# Patient Record
Sex: Male | Born: 1980 | Race: White | Hispanic: No | Marital: Married | Smoking: Never smoker
Health system: Southern US, Community
[De-identification: ages and names within clinical notes are randomized; demographics above are authoritative.]

## PROBLEM LIST (undated history)

## (undated) DIAGNOSIS — R7989 Other specified abnormal findings of blood chemistry: Secondary | ICD-10-CM

## (undated) DIAGNOSIS — R7309 Other abnormal glucose: Secondary | ICD-10-CM

## (undated) DIAGNOSIS — K219 Gastro-esophageal reflux disease without esophagitis: Secondary | ICD-10-CM

## (undated) DIAGNOSIS — M109 Gout, unspecified: Secondary | ICD-10-CM

## (undated) HISTORY — DX: Other abnormal glucose: R73.09

## (undated) HISTORY — PX: ACHILLES TENDON SURGERY: SHX542

## (undated) HISTORY — DX: Other specified abnormal findings of blood chemistry: R79.89

## (undated) HISTORY — DX: Gastro-esophageal reflux disease without esophagitis: K21.9

## (undated) HISTORY — PX: SHOULDER SURGERY: SHX246

## (undated) HISTORY — DX: Gout, unspecified: M10.9

---

## 2009-02-22 ENCOUNTER — Ambulatory Visit: Payer: Self-pay | Admitting: Family Medicine

## 2011-10-23 ENCOUNTER — Ambulatory Visit: Payer: Self-pay | Admitting: General Practice

## 2013-03-05 ENCOUNTER — Ambulatory Visit: Payer: Self-pay | Admitting: Orthopedic Surgery

## 2013-03-18 ENCOUNTER — Ambulatory Visit: Payer: Self-pay | Admitting: Orthopedic Surgery

## 2013-03-18 LAB — PLATELET COUNT: Platelet: 298 10*3/uL (ref 150–440)

## 2013-03-31 ENCOUNTER — Emergency Department: Payer: Self-pay | Admitting: Emergency Medicine

## 2013-04-18 ENCOUNTER — Emergency Department: Payer: Self-pay | Admitting: Emergency Medicine

## 2013-04-18 LAB — CBC
HGB: 13.2 g/dL (ref 13.0–18.0)
MCHC: 34.9 g/dL (ref 32.0–36.0)
RDW: 13.6 % (ref 11.5–14.5)
WBC: 15.4 10*3/uL — ABNORMAL HIGH (ref 3.8–10.6)

## 2013-04-18 LAB — BASIC METABOLIC PANEL
Anion Gap: 7 (ref 7–16)
Creatinine: 1.25 mg/dL (ref 0.60–1.30)
EGFR (African American): >60
Osmolality: 278 (ref 275–301)
Potassium: 3.3 mmol/L — ABNORMAL LOW (ref 3.5–5.1)
Sodium: 137 mmol/L (ref 136–145)

## 2014-03-11 IMAGING — US ULTRASOUND CORE BIOPSY
1 series · 13 of 13 positions shown · non-contrast
Comparison: none

REASON FOR EXAM: R ganglion cyst
COMMENTS:

PROCEDURE:     US  - US GUIDED BX/ASPIRATION NOT BR  - March 18, 2013  [DATE]
RESULT:     History: Ganglion cyst.

[Series 1: ultrasound core biopsy · 0.08mm/px · 13 acquisitions, 13 frames shown]
[im 1/13]
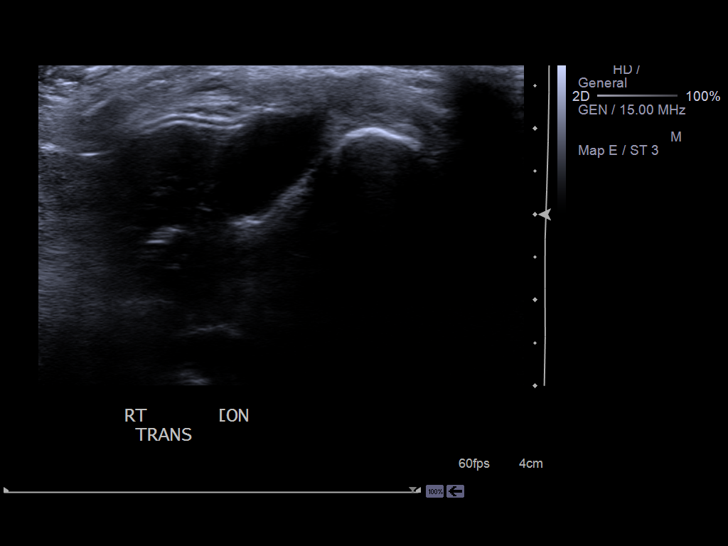
[im 2/13]
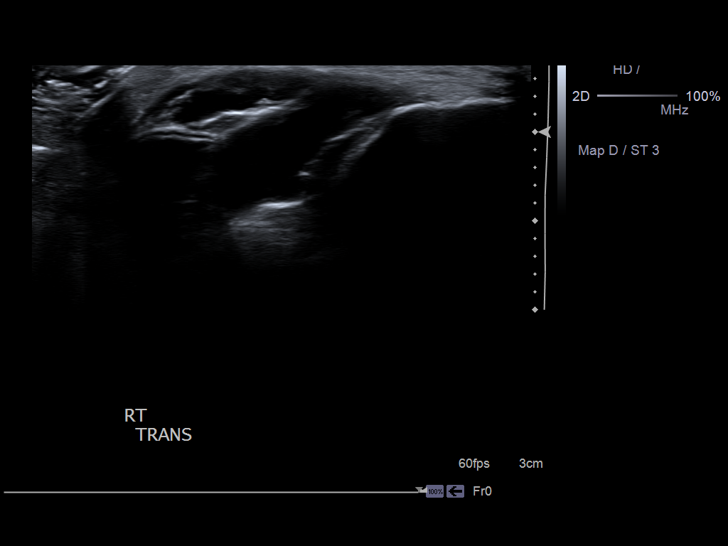
[im 3/13]
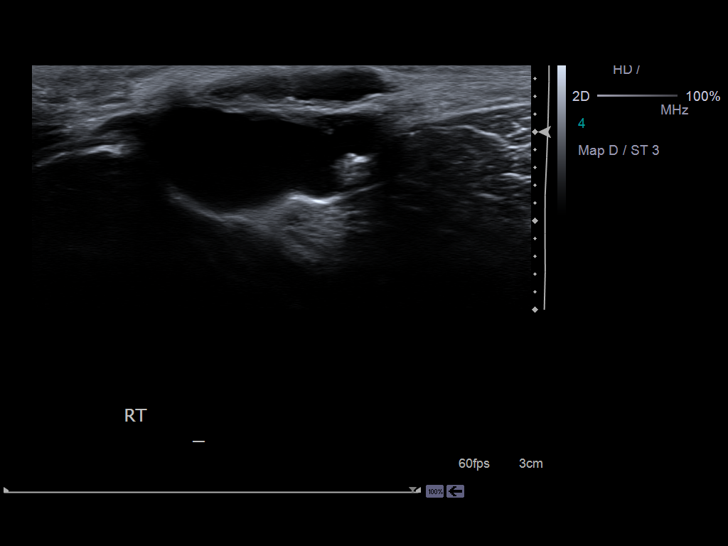
[im 4/13]
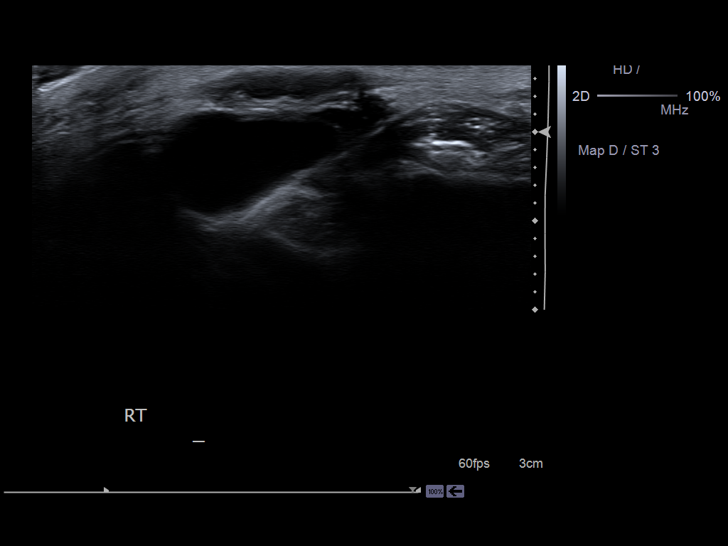
[im 5/13]
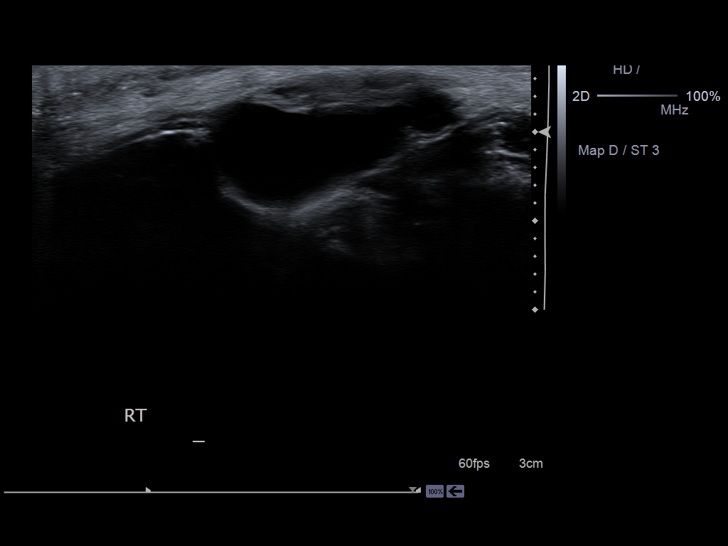
[im 6/13]
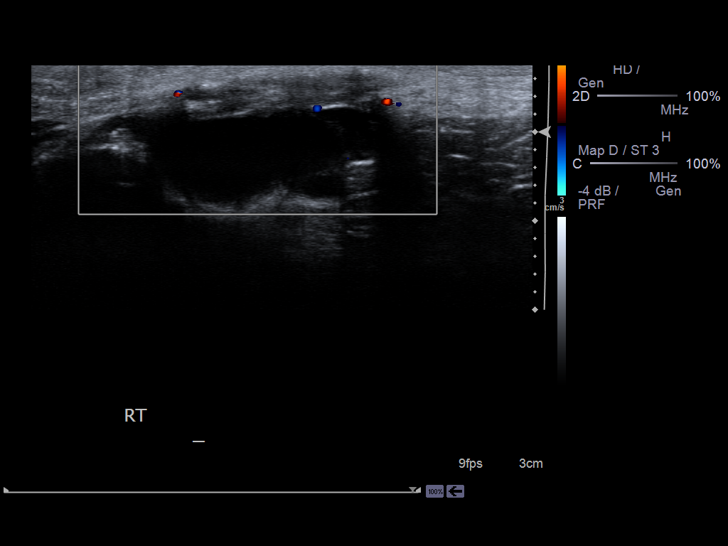
[im 7/13]
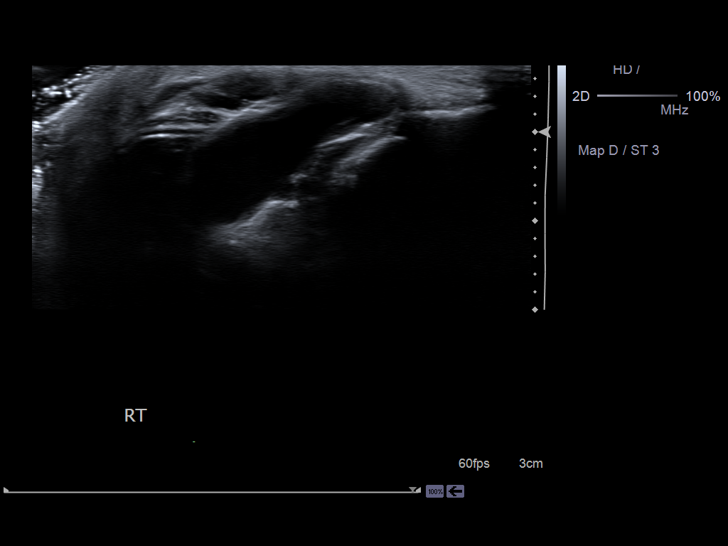
[im 8/13]
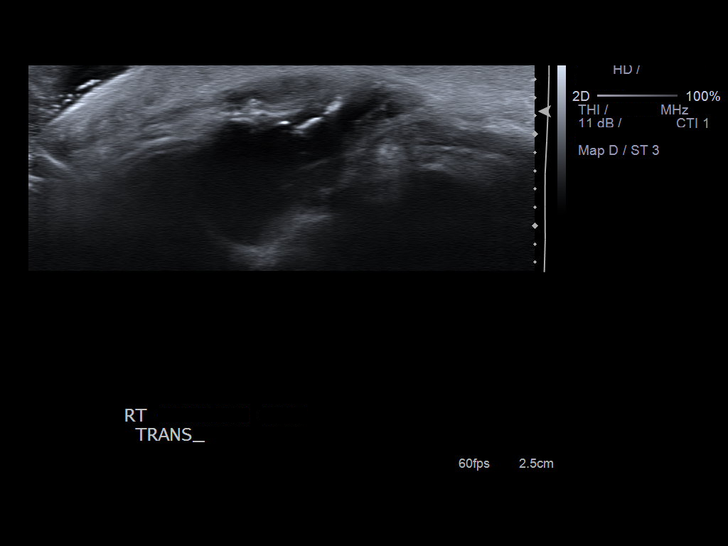
[im 9/13]
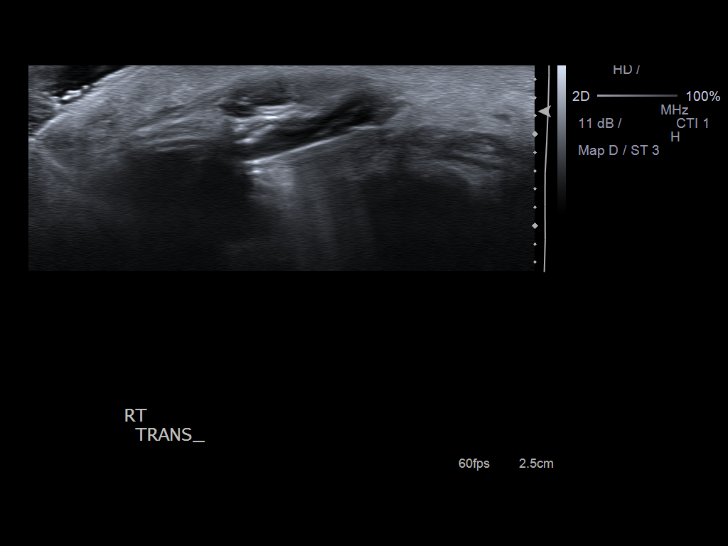
[im 10/13]
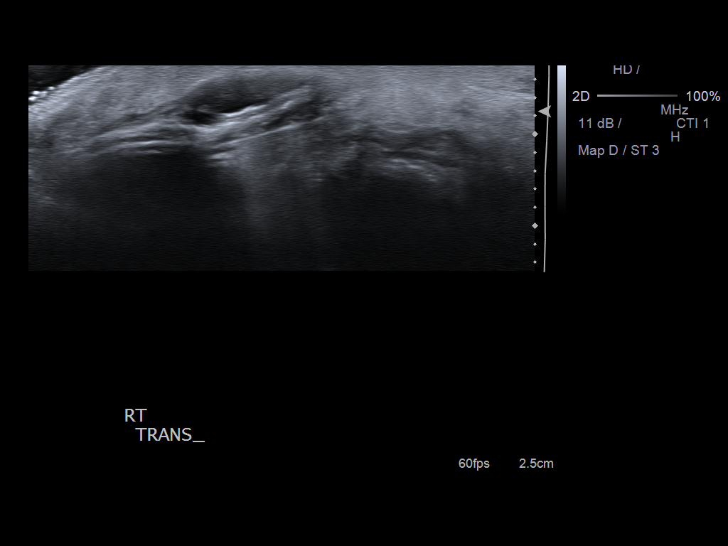
[im 11/13]
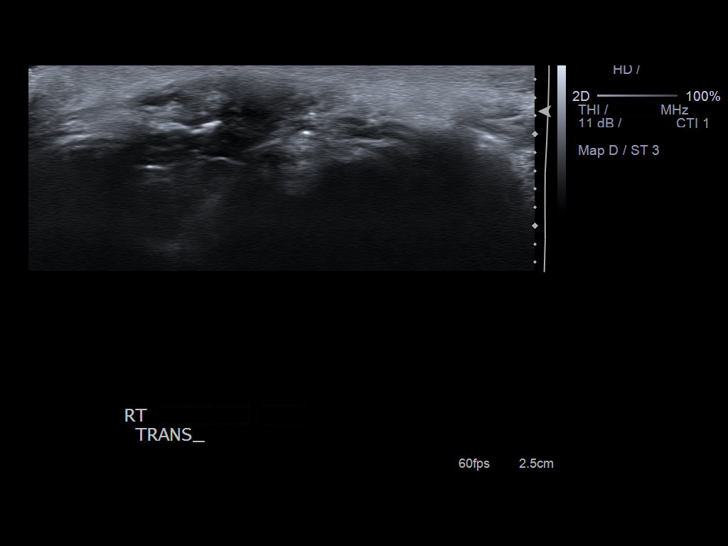
[im 12/13]
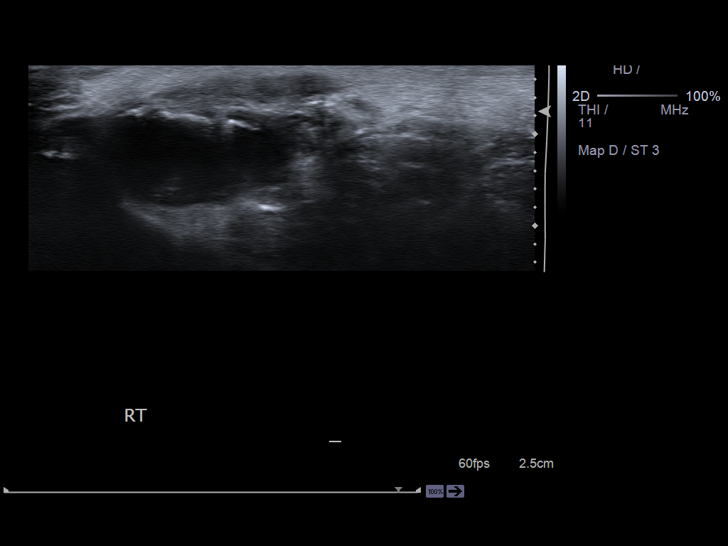
[im 13/13]
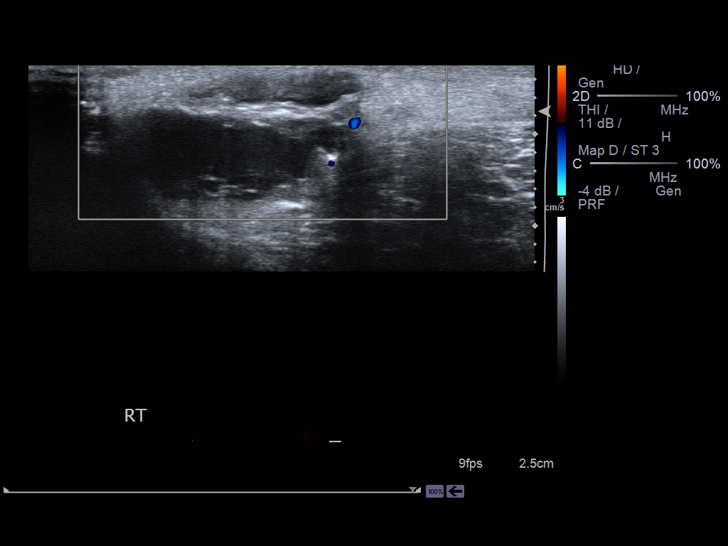

[13 of 13 positions shown; findings below may reference images not displayed]

FINDINGS: After discussing the risk and benefits of this procedure with
patient informed consent was obtained. Soft tissues about the cyst
sterilely prepped and draped and following local SC 1% lidocaine an 18-gauge
spinal needle was advanced into the ganglion cyst  and approximately 3 cc of
clear gelatinous fluid removed. Standardized mixture of flow 1% lidocaine
and Celestone administered into the cyst. No complications.
IMPRESSION: Successful right knee ganglia cyst aspiration and steroid
injection.

## 2014-06-03 ENCOUNTER — Ambulatory Visit: Payer: Self-pay | Admitting: General Practice

## 2014-06-10 ENCOUNTER — Emergency Department: Payer: Self-pay | Admitting: Emergency Medicine

## 2014-06-10 LAB — CBC WITH DIFFERENTIAL/PLATELET
BASOS ABS: 0.1 10*3/uL (ref 0.0–0.1)
Basophil %: 0.6 %
EOS PCT: 0.9 %
Eosinophil #: 0.1 10*3/uL (ref 0.0–0.7)
HCT: 42.1 % (ref 40.0–52.0)
HGB: 14.1 g/dL (ref 13.0–18.0)
LYMPHS ABS: 2.6 10*3/uL (ref 1.0–3.6)
LYMPHS PCT: 32.5 %
MCH: 27.8 pg (ref 26.0–34.0)
MCHC: 33.5 g/dL (ref 32.0–36.0)
MCV: 83 fL (ref 80–100)
MONO ABS: 0.5 x10 3/mm (ref 0.2–1.0)
MONOS PCT: 6.2 %
NEUTROS ABS: 4.7 10*3/uL (ref 1.4–6.5)
NEUTROS PCT: 59.8 %
Platelet: 358 10*3/uL (ref 150–440)
RBC: 5.07 10*6/uL (ref 4.40–5.90)
RDW: 13.7 % (ref 11.5–14.5)
WBC: 7.8 10*3/uL (ref 3.8–10.6)

## 2014-06-10 LAB — COMPREHENSIVE METABOLIC PANEL
ALBUMIN: 4.3 g/dL (ref 3.4–5.0)
ALK PHOS: 92 U/L
ANION GAP: 8 (ref 7–16)
BILIRUBIN TOTAL: 0.8 mg/dL (ref 0.2–1.0)
BUN: 9 mg/dL (ref 7–18)
CALCIUM: 9 mg/dL (ref 8.5–10.1)
CO2: 26 mmol/L (ref 21–32)
Chloride: 107 mmol/L (ref 98–107)
Creatinine: 0.97 mg/dL (ref 0.60–1.30)
EGFR (Non-African Amer.): >60
GLUCOSE: 90 mg/dL (ref 65–99)
OSMOLALITY: 279 (ref 275–301)
Potassium: 4.1 mmol/L (ref 3.5–5.1)
SGOT(AST): 46 U/L — ABNORMAL HIGH (ref 15–37)
SGPT (ALT): 58 U/L
Sodium: 141 mmol/L (ref 136–145)
Total Protein: 8.1 g/dL (ref 6.4–8.2)

## 2014-06-10 LAB — URINALYSIS, COMPLETE
BILIRUBIN, UR: NEGATIVE
Bacteria: NONE SEEN
Blood: NEGATIVE
Glucose,UR: NEGATIVE mg/dL (ref 0–75)
Nitrite: NEGATIVE
PH: 5 (ref 4.5–8.0)
PROTEIN: NEGATIVE
SPECIFIC GRAVITY: 1.024 (ref 1.003–1.030)
SQUAMOUS EPITHELIAL: NONE SEEN

## 2014-06-10 LAB — LIPASE, BLOOD: LIPASE: 88 U/L (ref 73–393)

## 2014-08-09 ENCOUNTER — Ambulatory Visit: Payer: Self-pay | Admitting: Unknown Physician Specialty

## 2014-08-20 ENCOUNTER — Ambulatory Visit: Payer: Self-pay | Admitting: Unknown Physician Specialty

## 2014-09-22 ENCOUNTER — Ambulatory Visit: Payer: Self-pay | Admitting: General Practice

## 2014-12-27 LAB — SURGICAL PATHOLOGY

## 2015-05-04 ENCOUNTER — Ambulatory Visit (INDEPENDENT_AMBULATORY_CARE_PROVIDER_SITE_OTHER): Payer: BLUE CROSS/BLUE SHIELD | Admitting: Podiatry

## 2015-05-04 ENCOUNTER — Ambulatory Visit (INDEPENDENT_AMBULATORY_CARE_PROVIDER_SITE_OTHER): Payer: BLUE CROSS/BLUE SHIELD

## 2015-05-04 ENCOUNTER — Encounter: Payer: Self-pay | Admitting: Podiatry

## 2015-05-04 VITALS — BP 122/77 | HR 73 | Resp 16 | Ht 72.0 in | Wt 205.0 lb

## 2015-05-04 DIAGNOSIS — M722 Plantar fascial fibromatosis: Secondary | ICD-10-CM

## 2015-05-04 MED ORDER — MELOXICAM 15 MG PO TABS: 15.0000 mg | ORAL_TABLET | Freq: Every day | ORAL | Status: DC

## 2015-05-04 MED ORDER — METHYLPREDNISOLONE 4 MG PO TBPK: ORAL_TABLET | ORAL | Status: DC

## 2015-05-04 NOTE — Patient Instructions (Signed)

## 2015-05-04 NOTE — Progress Notes (Signed)
   Subjective:    Patient ID: Travis Carpenter, male    DOB: 09/11/1980, 34 y.o.   MRN: [id number]  HPI presents today with a chief complaint of pain to the posterior aspect of his right heel and the plantar medial aspect of his left heel. He states that he also has some pain to the lateral aspect of the foot itself. States that his PCP diagnosed him with a gout attack a few weeks back and provided him with allopurinol and indomethacin. He states that portion of the foot still much better now. He states the pain on the plantar aspect of the right foot is painful particularly on ambulation initially and then is able to be stretched out. He also states that he has some tenderness in the posterior aspect of his right Achilles that is very similar.    Review of Systems  All other systems reviewed and are negative.      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 Neurologic sensorium is intact per Semmes-Weinstein monofilament. Deep tendon reflexes are intact bilaterally muscle strength +5 over 5 dorsiflexion plantar flexors and inverters everters all into the musculature is intact. Orthopedic evaluation and x-rays all joints distal to the ankle level range of motion without crepitation. He has pain on palpation medial calcaneal tubercle of the left heel as well as tenderness on palpation of the Achilles tendon on the posterior lateral aspect of the right heel. Radiographs 3 views each taken of bilateral foot demonstrates soft tissue increase in density plantar fascial calcaneal insertion sites bilaterally. Soft tissue increase in density at the Achilles tendon insertion site. Small Osier calcaneal heel spurs noted. Cutaneous evaluation of a straight supple well-hydrated cutis no erythema edema cellulitis drainage or odor.      Assessment & Plan:  Assessment: Insertional Achilles tendinitis right. Plantar  fasciitis left.  Plan: Discussed etiology pathology conservative versus surgical therapies. Started him on a Medrol Dosepak to be followed by meloxicam. Injected the medial calcaneal tubercle area today with Kenalog and local and aesthetic. Placed him in a plantar fascial brace and a night splint left. Discussed appropriate shoe gear straight excise and ice therapy which was dispensed today. I will follow-up with him in 1 month.

## 2015-05-15 DIAGNOSIS — S43431A Superior glenoid labrum lesion of right shoulder, initial encounter: Secondary | ICD-10-CM | POA: Insufficient documentation

## 2015-05-25 ENCOUNTER — Encounter: Payer: Self-pay | Admitting: Podiatry

## 2015-05-25 ENCOUNTER — Ambulatory Visit (INDEPENDENT_AMBULATORY_CARE_PROVIDER_SITE_OTHER): Payer: BLUE CROSS/BLUE SHIELD | Admitting: Podiatry

## 2015-05-25 VITALS — BP 134/83 | HR 65 | Resp 16

## 2015-05-25 DIAGNOSIS — M7661 Achilles tendinitis, right leg: Secondary | ICD-10-CM | POA: Diagnosis not present

## 2015-05-25 DIAGNOSIS — G5761 Lesion of plantar nerve, right lower limb: Secondary | ICD-10-CM

## 2015-05-25 DIAGNOSIS — M722 Plantar fascial fibromatosis: Secondary | ICD-10-CM | POA: Diagnosis not present

## 2015-05-25 DIAGNOSIS — G5791 Unspecified mononeuropathy of right lower limb: Secondary | ICD-10-CM

## 2015-05-25 NOTE — Progress Notes (Signed)
He presents today rates that his plantar fasciitis to his left foot is nearly 100% improved. He states that the Achilles laterally is still painful and he still has a burning sensation centrally located to the plantar aspect of the right foot. He states that this burning sensation has been there for quite a while and never changes. Whether he is on his feet or lying in bed he still has the burning sensation. It never intensifies and never changes. He continues to use his night splint though not regularly for his left foot. He continues his plantar fascial brace for his left foot regularly. He states that the medication really did not make a difference for his right foot.  Objective: Vital signs are stable he is alert and oriented 3. Pulses are strongly palpable. No pain on palpation medial calcaneal tubercle of the left heel. He does have some tenderness on palpation of the Achilles as it inserts on the posterior aspect of the right heel. He has no pain on palpation of the plantar calcaneal tubercle however that is the location of the burning.  Assessment: Well-healing plantar fasciitis left. Achilles tendinitis and neuritis right foot.  Plan: Discussed etiology pathology conservative versus surgical therapies. At this point I encouraged an injection to the right heel. This is performed with dexamethasone local anesthesia and was not injected into the tendon. I also injected with Kenalog to the plantar aspect of the right heel 2 in attempts to alleviate the burning. He will continue his antibiotic inflammatory is he was also scanned for several orthotics.

## 2015-06-01 ENCOUNTER — Encounter: Payer: Self-pay | Admitting: *Deleted

## 2015-06-15 ENCOUNTER — Encounter: Payer: Self-pay | Admitting: Podiatry

## 2015-06-15 ENCOUNTER — Ambulatory Visit (INDEPENDENT_AMBULATORY_CARE_PROVIDER_SITE_OTHER): Payer: BLUE CROSS/BLUE SHIELD | Admitting: Podiatry

## 2015-06-15 DIAGNOSIS — M722 Plantar fascial fibromatosis: Secondary | ICD-10-CM | POA: Diagnosis not present

## 2015-06-15 NOTE — Patient Instructions (Signed)

## 2015-06-16 NOTE — Progress Notes (Signed)
He presents today to pick up his orthotics. He states that the lateral aspect of his left foot is hurting. He states that the heel seems to be doing much better.  Objective: Vital signs are stable he is alert and oriented 3. Orthotics appear to fit accurately. He has some tenderness on palpation with overlying bursitis fifth metatarsal base left. No signs of fracture tendinitis no open wounds noted infection.  Assessment plantar fasciitis resolving with lateral compensatory syndrome and bursitis of the fifth metatarsal base fifth left.  Plan: I encouraged him to continue anti-inflammatories and utilize these orthotics. Follow up with him in 4-6 weeks.

## 2015-07-13 ENCOUNTER — Ambulatory Visit: Payer: BLUE CROSS/BLUE SHIELD | Admitting: Podiatry

## 2016-01-04 ENCOUNTER — Ambulatory Visit: Payer: BLUE CROSS/BLUE SHIELD | Admitting: Podiatry

## 2016-10-22 DIAGNOSIS — Z79899 Other long term (current) drug therapy: Secondary | ICD-10-CM | POA: Insufficient documentation

## 2016-10-22 DIAGNOSIS — M2550 Pain in unspecified joint: Secondary | ICD-10-CM | POA: Insufficient documentation

## 2016-10-22 DIAGNOSIS — M109 Gout, unspecified: Secondary | ICD-10-CM | POA: Insufficient documentation

## 2017-06-19 DIAGNOSIS — M25561 Pain in right knee: Secondary | ICD-10-CM | POA: Insufficient documentation

## 2018-04-04 ENCOUNTER — Emergency Department
Admission: EM | Admit: 2018-04-04 | Discharge: 2018-04-04 | Disposition: A | Discharge status: Home / Self Care | Payer: BLUE CROSS/BLUE SHIELD | Attending: Emergency Medicine | Admitting: Emergency Medicine

## 2018-04-04 ENCOUNTER — Encounter: Payer: Self-pay | Admitting: Emergency Medicine

## 2018-04-04 ENCOUNTER — Emergency Department: Payer: BLUE CROSS/BLUE SHIELD

## 2018-04-04 ENCOUNTER — Other Ambulatory Visit: Payer: Self-pay

## 2018-04-04 DIAGNOSIS — Y939 Activity, unspecified: Secondary | ICD-10-CM | POA: Diagnosis not present

## 2018-04-04 DIAGNOSIS — Y999 Unspecified external cause status: Secondary | ICD-10-CM | POA: Insufficient documentation

## 2018-04-04 DIAGNOSIS — Y929 Unspecified place or not applicable: Secondary | ICD-10-CM | POA: Diagnosis not present

## 2018-04-04 DIAGNOSIS — S46912A Strain of unspecified muscle, fascia and tendon at shoulder and upper arm level, left arm, initial encounter: Secondary | ICD-10-CM

## 2018-04-04 DIAGNOSIS — S46919A Strain of unspecified muscle, fascia and tendon at shoulder and upper arm level, unspecified arm, initial encounter: Secondary | ICD-10-CM | POA: Diagnosis not present

## 2018-04-04 DIAGNOSIS — S4992XA Unspecified injury of left shoulder and upper arm, initial encounter: Secondary | ICD-10-CM | POA: Diagnosis present

## 2018-04-04 MED ORDER — IBUPROFEN 600 MG PO TABS: 600.0000 mg | ORAL_TABLET | Freq: Three times a day (TID) | ORAL | 0 refills | Status: DC | PRN

## 2018-04-04 MED ORDER — DIAZEPAM 2 MG PO TABS: 2.0000 mg | ORAL_TABLET | Freq: Three times a day (TID) | ORAL | 0 refills | Status: DC | PRN
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
## 2018-04-04 NOTE — Discharge Instructions (Addendum)
Begin taking ibuprofen 600 mg 3 times daily with food for inflammation and pain.  Diazepam 2 mg as for muscle spasms.  You may take it every 8 hours if needed for muscle spasms but you cannot take this while driving.  This medication could cause drowsiness and increase your risk for injury.  Also use moist heat or ice to your shoulder for discomfort or swelling.  Follow-up with your primary care provider or [facility] acute care if any continued problems.

## 2019-02-09 ENCOUNTER — Other Ambulatory Visit: Payer: Self-pay

## 2019-02-09 ENCOUNTER — Encounter: Payer: Self-pay | Admitting: Podiatry

## 2019-02-09 ENCOUNTER — Ambulatory Visit (INDEPENDENT_AMBULATORY_CARE_PROVIDER_SITE_OTHER): Payer: BC Managed Care – PPO | Admitting: Podiatry

## 2019-02-09 ENCOUNTER — Ambulatory Visit: Payer: BC Managed Care – PPO

## 2019-02-09 VITALS — Temp 97.3°F

## 2019-02-09 DIAGNOSIS — M7661 Achilles tendinitis, right leg: Secondary | ICD-10-CM

## 2019-02-09 DIAGNOSIS — S86011A Strain of right Achilles tendon, initial encounter: Secondary | ICD-10-CM

## 2019-02-09 DIAGNOSIS — M722 Plantar fascial fibromatosis: Secondary | ICD-10-CM

## 2019-02-09 NOTE — Progress Notes (Signed)
  Subjective:  Patient ID: Travis Carpenter, male    DOB: 12-28-1980,  MRN: [id number] HPI Chief Complaint  Patient presents with  . 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 New Patient (Initial Visit)    Est pt 2016    38 y.o. male presents with the above complaint.   ROS: Denies fever chills nausea vomiting muscle aches pains calf pain back pain chest pain shortness of breath.  No past medical history on file. No past surgical history on file.  Current Outpatient Medications:  .  celecoxib (CELEBREX) 200 MG capsule, , Disp: , Rfl:   Allergies  Allergen Reactions  . Cortisone     Passed out after injection - patient stated it was not out fear, but something reacting to his body. He has since had a cortisone injection in his shoulder that he did fine with.   Review of Systems Objective:   Vitals:   02/09/19 1413  Temp: (!) 97.3 F (36.3 C)    General: Well developed, nourished, in no acute distress, alert and oriented x3   Dermatological: Skin is warm, dry and supple bilateral. Nails x 10 are well maintained; remaining integument appears unremarkable at this time. There are no open sores, no preulcerative lesions, no rash or signs of infection present.  Pain on palpation of soft tissue nodule in the posterior aspect of the calcaneus.  Cannot be sure if this an underlying bursitis or spur that is resulting in this.  Vascular: Dorsalis Pedis artery and Posterior Tibial artery pedal pulses are 2/4 bilateral with immedate capillary fill time. Pedal hair growth present. No varicosities and no lower extremity edema present bilateral.   Neruologic: Grossly intact via light touch bilateral. Vibratory intact via tuning fork bilateral. Protective threshold with Semmes Wienstein monofilament intact to all pedal sites bilateral. Patellar and Achilles deep tendon reflexes 2+ bilateral. No  Babinski or clonus noted bilateral.   Musculoskeletal: No gross boney pedal deformities bilateral. No pain, crepitus, or limitation noted with foot and ankle range of motion bilateral. Muscular strength 5/5 in all groups tested bilateral.  He has pain on palpation of the Achilles at its insertion site.  He has tight heel cord right.  Mildly erythematous and edematous with pain on direct palpation.  Gait: Unassisted, Nonantalgic.    Radiographs:  None were taken but I did review a read from her previous radiographs which did demonstrate a posterior spur.  Assessment & Plan:   Assessment: Insertional Achilles tendinitis cannot rule out insertional tear cannot rule out a foreign body in the soft tissue the posterior heel.  Plan: Requesting MRI for evaluation of the distal Achilles and retrocalcaneal area.     Max T. Appleby, [place]

## 2019-02-13 ENCOUNTER — Telehealth: Payer: Self-pay

## 2019-02-13 ENCOUNTER — Other Ambulatory Visit: Payer: Self-pay

## 2019-02-13 DIAGNOSIS — S86011A Strain of right Achilles tendon, initial encounter: Secondary | ICD-10-CM

## 2019-02-13 NOTE — Telephone Encounter (Signed)
-----   Message from Rip Harbour, [facility] sent at 02/09/2019  2:54 PM EDT ----- Regarding: MRI MRI right ankle - evaluate achilles tendon tear right - surgical consideration

## 2019-02-13 NOTE — Telephone Encounter (Signed)
MRI Approved from 02/13/2019 to 08/11/2019 Auth # [id number]  Patient has been notified of approval via voice mail and informed to call scheduling dept to set up own appt at his convenience.

## 2019-03-02 ENCOUNTER — Ambulatory Visit
Admission: RE | Admit: 2019-03-02 | Discharge: 2019-03-02 | Disposition: A | Discharge status: Home / Self Care | Payer: BC Managed Care – PPO | Source: Ambulatory Visit | Attending: Podiatry | Admitting: Podiatry

## 2019-03-02 ENCOUNTER — Other Ambulatory Visit: Payer: Self-pay

## 2019-03-02 DIAGNOSIS — S86011A Strain of right Achilles tendon, initial encounter: Secondary | ICD-10-CM

## 2019-03-03 ENCOUNTER — Telehealth: Payer: Self-pay | Admitting: *Deleted

## 2019-03-03 ENCOUNTER — Telehealth: Payer: Self-pay | Admitting: Podiatry

## 2019-03-03 NOTE — Telephone Encounter (Signed)
Patient was returning call, please call on [phone].

## 2019-03-03 NOTE — Telephone Encounter (Signed)
Left message for patient - MRI will be sent for over read and appointment will be needed at that time to discuss results.

## 2019-03-03 NOTE — Telephone Encounter (Signed)
-----   Message from Garrel Ridgel, [place] sent at 03/03/2019  7:37 AM EDT ----- I need an over read and the please inform the patient of the delay.

## 2019-03-03 NOTE — Telephone Encounter (Signed)
Sent fax over to [facility] requesting copy of MRI to send out for overread

## 2019-03-03 NOTE — Telephone Encounter (Signed)
Spoke to pt directly - let him know of delay of MRI results

## 2019-03-03 NOTE — Telephone Encounter (Signed)
   SI   Travis Carpenter  Male, 38 y.o., 1980/09/26  MRN: [id number]     Phone: [phone] (H) ...  PCP: Idelle Crouch, MD  Primary Cvg: [employer] Ppo       Next Appt  With Podiatry Garrel Ridgel, DPM) 03/18/2019 at 3:20 PM          Advice Only Horton, Levada Dy B routed conversation to You 3 hours ago (1:21 PM)      Horton, Levada Dy B 3 hours ago (1:21 PM)   Patient was returning call, please call on [phone].     Documentation        Roberta, Kelly [phone] Priscille Loveless B 3 hours ago (1:20 PM)                  Recent Patient Communication   Last Update Reason Specialty     Today Advice Only Podiatry    [place], [place] T Closed    Today Results Podiatry    [facility], Avanell [place] Open    2 weeks ago - Podiatry    [facility] [place]

## 2019-03-10 NOTE — Telephone Encounter (Signed)
Mailed copy of MRI disc to [facility]. 

## 2019-03-18 ENCOUNTER — Ambulatory Visit: Payer: BC Managed Care – PPO | Admitting: Podiatry

## 2019-03-23 ENCOUNTER — Encounter: Payer: Self-pay | Admitting: Podiatry

## 2019-03-24 ENCOUNTER — Telehealth: Payer: Self-pay | Admitting: *Deleted

## 2019-03-24 NOTE — Telephone Encounter (Signed)
-----   Message from Roney Jaffe, RN sent at 03/23/2019  2:37 PM EDT -----  ----- Message ----- From: Garrel Ridgel, DPM Sent: 03/23/2019   1:05 PM EDT To: Andres Ege, RN  NO tear of the achilles.  Might consider PT and if there is no change then follow up.

## 2019-03-24 NOTE — Telephone Encounter (Signed)
Called patient to inform him of MRI results-L/M - no achilles tear, recommended PT, if he would like to proceed with PT call back and let me know and I would send in the order for that.

## 2019-03-25 ENCOUNTER — Ambulatory Visit (INDEPENDENT_AMBULATORY_CARE_PROVIDER_SITE_OTHER): Payer: 59 | Admitting: Podiatry

## 2019-03-25 ENCOUNTER — Other Ambulatory Visit: Payer: Self-pay

## 2019-03-25 ENCOUNTER — Encounter: Payer: Self-pay | Admitting: Podiatry

## 2019-03-25 VITALS — Temp 98.2°F

## 2019-03-25 DIAGNOSIS — M7661 Achilles tendinitis, right leg: Secondary | ICD-10-CM | POA: Diagnosis not present

## 2019-03-25 NOTE — Progress Notes (Signed)
He presents today for follow-up of his Achilles tendinitis and then a MRI report.  He states that it still really hurts has not had much of a change.  Objective: Vital signs are stable he is alert and oriented x3.  Painful Achilles right on palpation at its insertion site.  MRI does state that there is an enthesopathy present but no true Achilles tendinitis but it does appear that there may be an insertional tendinopathy.  Assessment: Achilles tendinopathy with insertional enthesopathy.  Plan: We discussed the pros and cons of physical therapy today versus shockwave therapy versus open Achilles tendon repair.  He would like to talk to his wife about this after he and Sharyn Lull talk he will get back to [place] with their decision.

## 2019-04-08 ENCOUNTER — Encounter: Payer: Self-pay | Admitting: Podiatry

## 2019-04-08 ENCOUNTER — Other Ambulatory Visit: Payer: Self-pay

## 2019-04-08 ENCOUNTER — Ambulatory Visit (INDEPENDENT_AMBULATORY_CARE_PROVIDER_SITE_OTHER): Payer: 59 | Admitting: Podiatry

## 2019-04-08 ENCOUNTER — Telehealth: Payer: Self-pay | Admitting: Podiatry

## 2019-04-08 VITALS — Temp 98.1°F

## 2019-04-08 DIAGNOSIS — M7661 Achilles tendinitis, right leg: Secondary | ICD-10-CM | POA: Diagnosis not present

## 2019-04-08 NOTE — Patient Instructions (Signed)
Pre-Operative Instructions  Congratulations, you have decided to take an important step towards improving your quality of life.  You can be assured that the doctors and staff at [facility] will be with you every step of the way.  Here are some important things you should know:  1. Plan to be at the surgery center/hospital at least 1 (one) hour prior to your scheduled time, unless otherwise directed by the surgical center/hospital staff.  You must have a responsible adult accompany you, remain during the surgery and drive you home.  Make sure you have directions to the surgical center/hospital to ensure you arrive on time. 2. If you are having surgery at [facility] or [facility] hospitals, you will need a copy of your medical history and physical form from your family physician within one month prior to the date of surgery. We will give you a form for your primary physician to complete.  3. We make every effort to accommodate the date you request for surgery.  However, there are times where surgery dates or times have to be moved.  We will contact you as soon as possible if a change in schedule is required.   4. No aspirin/ibuprofen for one week before surgery.  If you are on aspirin, any non-steroidal anti-inflammatory medications (Mobic, Aleve, Ibuprofen) should not be taken seven (7) days prior to your surgery.  You make take Tylenol for pain prior to surgery.  5. Medications - If you are taking daily heart and blood pressure medications, seizure, reflux, allergy, asthma, anxiety, pain or diabetes medications, make sure you notify the surgery center/hospital before the day of surgery so they can tell you which medications you should take or avoid the day of surgery. 6. No food or drink after midnight the night before surgery unless directed otherwise by surgical center/hospital staff. 7. No alcoholic beverages 24-hours prior to surgery.  No smoking 24-hours prior or 24-hours after  surgery. 8. Wear loose pants or shorts. They should be loose enough to fit over bandages, boots, and casts. 9. Don't wear slip-on shoes. Sneakers are preferred. 10. Bring your boot with you to the surgery center/hospital.  Also bring crutches or a walker if your physician has prescribed it for you.  If you do not have this equipment, it will be provided for you after surgery. 11. If you have not been contacted by the surgery center/hospital by the day before your surgery, call to confirm the date and time of your surgery. 12. Leave-time from work may vary depending on the type of surgery you have.  Appropriate arrangements should be made prior to surgery with your employer. 13. Prescriptions will be provided immediately following surgery by your doctor.  Fill these as soon as possible after surgery and take the medication as directed. Pain medications will not be refilled on weekends and must be approved by the doctor. 14. Remove nail polish on the operative foot and avoid getting pedicures prior to surgery. 15. Wash the night before surgery.  The night before surgery wash the foot and leg well with water and the antibacterial soap provided. Be sure to pay special attention to beneath the toenails and in between the toes.  Wash for at least three (3) minutes. Rinse thoroughly with water and dry well with a towel.  Perform this wash unless told not to do so by your physician.  Enclosed: 1 Ice pack (please put in freezer the night before surgery)   1 Hibiclens skin cleaner     Pre-op instructions  If you have any questions regarding the instructions, please do not hesitate to call our office.  [place]: [address], [place], [place] [address] -- [phone]  [place]: [address] [place], [place] [address] -- [phone]  [place]: [address]  , [place] [address] -- [phone]  [place]: [address], [address], [place], [place] [address] -- [phone]  Website:  [email or web address] 

## 2019-04-08 NOTE — Telephone Encounter (Signed)
I saw Dr. Milinda Pointer today and was calling to schedule my surgery for Friday, 28 August. I told the pt I would put him in the book for that date and follow up with him tomorrow once I have his paperwork. I told him that he could go ahead and register with [facility] online. I also informed him someone would call him from there a day or two prior to his surgery to let him know what time to arrive.

## 2019-04-08 NOTE — Progress Notes (Signed)
He presents today for surgical consult regarding his Achilles tendon of his right leg.  He would like to go ahead and get it fixed as soon as possible.  Objective: Vital signs are stable he is alert and oriented x3 he has tight gastrosoleus complex with gastroc equinus.  This is resulted in a tear of the Achilles and its insertional point on the posterior heel.  He also has some plantar fasciitis all diagnosed per MRI.  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  Plan: Discussed etiology pathology conservative versus surgical therapies at this point consented him for a gastroc recession retrocalcaneal heel spur resection Achilles tenolysis and endoscopic fasciotomy with a cast.  I answered all questions regarding these procedures best my ability layman's terms understood and was amenable to it signed a 3 page of the consent form.

## 2019-04-09 NOTE — Telephone Encounter (Signed)
Called and lvm that I received consent forms and will get entered into [employer]. Told him someone would call a day or two prior to his surgery to let him know what time to arrive. Told him to call with any questions and/or concerns.

## 2019-04-14 ENCOUNTER — Telehealth: Payer: Self-pay | Admitting: *Deleted

## 2019-04-14 NOTE — Telephone Encounter (Signed)
"  I'm scheduled for surgery on August 28 with Dr. Milinda Pointer.  I need to change that."  Do you have a date that you would like?  "We've had someone that had to have an emergency surgery so I have to take care of them.  Is there anyway we can do it after October 10?"  Yes, Dr. Milinda Pointer can do it on June 19, 2019.  "That date will be fine."  I'll get it rescheduled.  I changed the date from 05/01/2019 to 06/19/2019 via the surgical center's [employer] Portal.

## 2019-04-14 NOTE — Telephone Encounter (Signed)
Thanks for letting me know!

## 2019-06-04 ENCOUNTER — Telehealth: Payer: Self-pay | Admitting: *Deleted

## 2019-06-04 NOTE — Telephone Encounter (Signed)
Spoke with the representative Fanny Skates) at [facility] and there is no pre-cert required and the reference number is [id number]. Travis Carpenter

## 2019-06-17 ENCOUNTER — Telehealth: Payer: Self-pay | Admitting: *Deleted

## 2019-06-17 NOTE — Telephone Encounter (Signed)
"  We have to cancel Travis Carpenter's surgery for Friday.  His Covid test came back positive."  I will cancel it.  "We'll call the patient and let him know.  Would you like a copy of the report?"  Yes, send it to me, please.

## 2019-06-17 NOTE — Telephone Encounter (Signed)
I have canceled his postop visits and took it off of Dr. Stephenie Acres schedule in [place] on Friday.

## 2019-06-17 NOTE — Telephone Encounter (Signed)
Take it off of Dr. Stephenie Acres schedule in [place].

## 2019-06-17 NOTE — Telephone Encounter (Signed)
Do I need to do anything other than cancelling his postop visits?

## 2019-06-23 ENCOUNTER — Telehealth: Payer: Self-pay | Admitting: Podiatry

## 2019-06-23 NOTE — Telephone Encounter (Signed)
I was calling to get a surgery date. If you would, just give me a call back.

## 2019-06-24 ENCOUNTER — Telehealth: Payer: Self-pay

## 2019-06-24 NOTE — Telephone Encounter (Signed)
I'm calling to get my surgery rescheduled. I've gotten everything straightened out from my last COVID-19 test. I've worked it out where I can do the surgery on  07/03/19 but was told to call and schedule the surgery with you. Please call me back. Thank you.

## 2019-06-24 NOTE — Telephone Encounter (Signed)
Called pt and confirmed that I did get his surgery rescheduled to next Friday, 07/03/19. Told pt I had already contacted the surgical center and that they would give him a call a day or two prior to his surgery to let him know what time to arrive.

## 2019-06-24 NOTE — Telephone Encounter (Signed)
Left message letting Caren Griffins know I left a voicemail for pt that we are rescheduling his surgery for 07/03/2019. Told her to call me back and let me know if she needed any of the papers/consent forms faxed or e-mailed to her.

## 2019-06-24 NOTE — Telephone Encounter (Signed)
Confirmed sx date with pt.

## 2019-06-24 NOTE — Telephone Encounter (Signed)
Patient left voice mail on nurse line , he had a question about his surgery date coming up with Dr Milinda Pointer.Please give him a call [phone]   Thanks!

## 2019-06-24 NOTE — Telephone Encounter (Signed)
Left voicemail letting pt know I got him rescheduled for surgery on 07/03/2019. I told him I would contact the surgical center to confirm the new sx date with them and re-send any paperwork and that someone from there would call him a day or two prior to let him know what time to arrive for his surgery. Told pt to call me back directly with any other questions.

## 2019-06-26 ENCOUNTER — Encounter: Payer: 59 | Admitting: Podiatry

## 2019-07-01 ENCOUNTER — Encounter: Payer: 59 | Admitting: Podiatry

## 2019-07-02 ENCOUNTER — Other Ambulatory Visit: Payer: Self-pay | Admitting: Podiatry

## 2019-07-02 MED ORDER — ONDANSETRON HCL 4 MG PO TABS: 4.0000 mg | ORAL_TABLET | Freq: Three times a day (TID) | ORAL | 0 refills | Status: DC | PRN

## 2019-07-02 MED ORDER — OXYCODONE-ACETAMINOPHEN 10-325 MG PO TABS: 1.0000 | ORAL_TABLET | Freq: Three times a day (TID) | ORAL | 0 refills | Status: AC | PRN

## 2019-07-02 MED ORDER — CEPHALEXIN 500 MG PO CAPS: 500.0000 mg | ORAL_CAPSULE | Freq: Three times a day (TID) | ORAL | 0 refills | Status: DC

## 2019-07-03 ENCOUNTER — Telehealth: Payer: Self-pay | Admitting: *Deleted

## 2019-07-03 ENCOUNTER — Encounter: Payer: Self-pay | Admitting: Podiatry

## 2019-07-03 DIAGNOSIS — M7731 Calcaneal spur, right foot: Secondary | ICD-10-CM

## 2019-07-03 DIAGNOSIS — M65871 Other synovitis and tenosynovitis, right ankle and foot: Secondary | ICD-10-CM

## 2019-07-03 DIAGNOSIS — M216X1 Other acquired deformities of right foot: Secondary | ICD-10-CM

## 2019-07-03 DIAGNOSIS — M722 Plantar fascial fibromatosis: Secondary | ICD-10-CM | POA: Diagnosis not present

## 2019-07-03 NOTE — Telephone Encounter (Signed)
I spoke with pt's wife Sharyn Lull, and informed the medications had been sent to the [facility] on [address].

## 2019-07-03 NOTE — Telephone Encounter (Signed)
-----   Message from Simone Curia sent at 07/03/2019  1:47 PM EDT ----- Regarding: Pain medication Patient's wife called and stated that Jenna had sx today but pain meds were not at the pharmacy. They are concerned with it being Friday that they may not get the meds before Monday. Please call patient back @ [phone]. Thanks!

## 2019-07-03 NOTE — Telephone Encounter (Signed)
Pt's wife, Sharyn Lull states Dr. Milinda Pointer said a pain medication was sent to his pharmacy, but it is not there. I reviewed pt's medication orders the [facility] [address] [place] yesterday 7:15am. Sharyn Lull states pt's medications usually go to [facility]. I changed the pharmacy to [facility] for future orders.

## 2019-07-08 ENCOUNTER — Ambulatory Visit (INDEPENDENT_AMBULATORY_CARE_PROVIDER_SITE_OTHER): Payer: Self-pay | Admitting: Podiatry

## 2019-07-08 ENCOUNTER — Encounter: Payer: Self-pay | Admitting: Podiatry

## 2019-07-08 ENCOUNTER — Ambulatory Visit (INDEPENDENT_AMBULATORY_CARE_PROVIDER_SITE_OTHER): Payer: 59

## 2019-07-08 ENCOUNTER — Other Ambulatory Visit: Payer: Self-pay

## 2019-07-08 VITALS — BP 104/77 | HR 97 | Temp 97.1°F

## 2019-07-08 DIAGNOSIS — M722 Plantar fascial fibromatosis: Secondary | ICD-10-CM | POA: Diagnosis not present

## 2019-07-08 DIAGNOSIS — Z9889 Other specified postprocedural states: Secondary | ICD-10-CM

## 2019-07-08 DIAGNOSIS — S86011A Strain of right Achilles tendon, initial encounter: Secondary | ICD-10-CM | POA: Diagnosis not present

## 2019-07-08 NOTE — Progress Notes (Signed)
He presents today date of surgery 07/03/2019 status post EPF right gastroc recession and Achilles tendon repair.  He denies fever chills nausea vomiting muscle aches and pains.  States that the worst thing is the cast rubbing his fifth toe on the right foot.  Objective: Presents nonweightbearing with crutches right lower extremity cast intact dry and clean cast is loose at the top and has good range of motion with good sensation in the tips of the toes.  Radiographs demonstrate complete resection of the spur with minimal swelling of the Achilles.  Well-healing surgical foot.  Plan: I took out a wedge of cast today to allow for his toe to move.  And I will follow-up with him in 1 week for total cast change.

## 2019-07-15 ENCOUNTER — Encounter: Payer: 59 | Admitting: Podiatry

## 2019-07-17 ENCOUNTER — Other Ambulatory Visit: Payer: Self-pay

## 2019-07-17 ENCOUNTER — Ambulatory Visit (INDEPENDENT_AMBULATORY_CARE_PROVIDER_SITE_OTHER): Payer: 59 | Admitting: Podiatry

## 2019-07-17 DIAGNOSIS — S86011A Strain of right Achilles tendon, initial encounter: Secondary | ICD-10-CM | POA: Diagnosis not present

## 2019-07-17 DIAGNOSIS — Z9889 Other specified postprocedural states: Secondary | ICD-10-CM

## 2019-07-17 DIAGNOSIS — M722 Plantar fascial fibromatosis: Secondary | ICD-10-CM

## 2019-07-20 ENCOUNTER — Other Ambulatory Visit: Payer: Self-pay

## 2019-07-20 ENCOUNTER — Other Ambulatory Visit: Payer: 59

## 2019-07-21 NOTE — Progress Notes (Signed)
   Subjective:  Patient presents today status post posterior heel spur resection, EPF, gastrocnemius lengthening right. DOS: 07/03/2019. He states he is doing well. He denies any significant pain or modifying factors. He has been nonweightbearing as directed. Patient is here for further evaluation and treatment.   No past medical history on file.    Objective/Physical Exam Neurovascular status intact.  Skin incisions appear to be well coapted with sutures and staples intact. No sign of infectious process noted. No dehiscence. No active bleeding noted. Moderate edema noted to the surgical extremity.  Assessment: 1. s/p posterior heel spur resection, EPF, gastrocnemius lengthening right. DOS: 07/03/2019   Plan of Care:  1. Patient was evaluated. 2. Cast removed and reapplied.  3. Partial sutures and staples removed.  4. Continue nonweightbearing.  5. Return to clinic in 2 weeks.    Edrick Kins, DPM [facility]  Dr. Edrick Kins, [address]                                        [place], [place] [address]                Office [phone]  Fax [phone]

## 2019-07-27 ENCOUNTER — Encounter: Payer: Self-pay | Admitting: Podiatry

## 2019-07-27 ENCOUNTER — Encounter: Payer: 59 | Admitting: Podiatry

## 2019-07-27 ENCOUNTER — Other Ambulatory Visit: Payer: Self-pay

## 2019-07-27 ENCOUNTER — Ambulatory Visit (INDEPENDENT_AMBULATORY_CARE_PROVIDER_SITE_OTHER): Payer: 59 | Admitting: Podiatry

## 2019-07-27 DIAGNOSIS — S86011A Strain of right Achilles tendon, initial encounter: Secondary | ICD-10-CM

## 2019-07-27 DIAGNOSIS — Z9889 Other specified postprocedural states: Secondary | ICD-10-CM

## 2019-07-27 DIAGNOSIS — M722 Plantar fascial fibromatosis: Secondary | ICD-10-CM

## 2019-07-27 MED ORDER — METHYLPREDNISOLONE 4 MG PO TBPK: ORAL_TABLET | ORAL | 0 refills | Status: DC

## 2019-07-27 NOTE — Progress Notes (Signed)
He presents today date of surgery 07/03/2019 status post EPF right Achilles tenolysis right gastroc recession right retrocalcaneal heel spur resection right manipulation of ankle right and cast application right.  He denies fever chills nausea vomiting muscle aches pains chest pain back pain shortness of breath.  Objective: Presents none ambulatory nonweightbearing status utilizing crutches.  Cast intact once removed demonstrates staples are intact margins are well coapted staples were removed no dehiscence no erythema cellulitis drainage or odor.  He has plantarflexion against resistance nice and strong.  He does have some swelling about the first metatarsophalangeal joint which it does appear to be gout and that it is mildly erythematous warm to the touch exquisitely tender.  He has a history of gout.  Assessment: Well-healing surgical leg and foot.  Gout.  Right.  Plan: Placed him in a cam walker with compression anklet today with a steroid Dosepak.  I encouraged him to start ambulating on the foot partial weightbearing at first progressing to full weightbearing as long as he is wearing his cam boot.  I will follow-up with him in 2 weeks.

## 2019-07-27 NOTE — Addendum Note (Signed)
Addended by: Rip Harbour on: 07/27/2019 02:33 PM   Modules accepted: Orders

## 2019-08-05 ENCOUNTER — Other Ambulatory Visit: Payer: Self-pay

## 2019-08-05 ENCOUNTER — Ambulatory Visit (INDEPENDENT_AMBULATORY_CARE_PROVIDER_SITE_OTHER): Payer: Self-pay | Admitting: Podiatry

## 2019-08-05 DIAGNOSIS — Z9889 Other specified postprocedural states: Secondary | ICD-10-CM

## 2019-08-05 DIAGNOSIS — S86011A Strain of right Achilles tendon, initial encounter: Secondary | ICD-10-CM

## 2019-08-05 MED ORDER — DOXYCYCLINE HYCLATE 100 MG PO TABS
100.0000 mg | ORAL_TABLET | Freq: Two times a day (BID) | ORAL | 0 refills | Status: DC
Start: 2019-08-05 — End: 2019-08-31

## 2019-08-05 MED ORDER — MUPIROCIN 2 % EX OINT: TOPICAL_OINTMENT | CUTANEOUS | 2 refills | Status: DC

## 2019-08-05 MED ORDER — INDOMETHACIN 50 MG RE SUPP: 50.0000 mg | Freq: Two times a day (BID) | RECTAL | 3 refills | Status: DC

## 2019-08-05 NOTE — Progress Notes (Signed)
He presents today for a follow-up of his retrocalcaneal bursitis Achilles tendon repair gastroc recession all on the right.  States that there is a mild dehiscence of the wound no purulence no malodor no fever chills states that his gout has now subsided.  Objective: Vital signs are stable he is alert oriented x3.  Pulses are palpable.  Right erythematous painful first metatarsophalangeal joint of the right foot which is warm to the touch very symptomatic of gout.  Very mild dehiscence to the distalmost aspect of the wound appears to be more of an epidermal problem does not have any drainage on the bandage at all.  Assessment: Well-healing surgical foot Achilles tendon repair right mild dehiscence distally gout first metatarsophalangeal joint.  Plan: Start him on indomethacin 50 mg 1 p.o. twice daily and also started him on doxycycline and Bactroban ointment to be applied twice daily after cleaning with a light dressing.  Continue use of the cam walker follow-up with him in 1 to 2 weeks for distal recheck.

## 2019-08-10 ENCOUNTER — Encounter: Payer: 59 | Admitting: Podiatry

## 2019-08-12 ENCOUNTER — Encounter: Payer: Self-pay | Admitting: Podiatry

## 2019-08-12 ENCOUNTER — Ambulatory Visit (INDEPENDENT_AMBULATORY_CARE_PROVIDER_SITE_OTHER): Payer: Self-pay | Admitting: Podiatry

## 2019-08-12 ENCOUNTER — Other Ambulatory Visit: Payer: Self-pay

## 2019-08-12 DIAGNOSIS — Z9889 Other specified postprocedural states: Secondary | ICD-10-CM

## 2019-08-12 DIAGNOSIS — S86011A Strain of right Achilles tendon, initial encounter: Secondary | ICD-10-CM

## 2019-08-12 DIAGNOSIS — M722 Plantar fascial fibromatosis: Secondary | ICD-10-CM

## 2019-08-12 NOTE — Progress Notes (Signed)
He presents today date of surgery 07/03/2019 status post EPF right Achilles tenolysis gastroc recession calcaneal ostectomy and manipulation of the ankle in the cast.  States that is doing okay is feeling sore at that spot where it drains still and it stings a little bit but all in all it is fine he denies fever chills nausea vomiting muscle aches and pains dresses it twice a day.  Objective: Vital signs are stable alert and oriented x3.  Pulses are palpable.  Surgical site appears to be healing very nicely I went ahead and removed any of the necrotic tissue today.  It bled a slight amount but there is no purulence no malodor and the erythema has diminished considerably.  There is no true area of delineation that is split open just a mild area of very superficial wound healing.  Assessment: Well-healing surgical site posterior aspect right.  Plan: Continue current therapies daily and I will follow-up with him in 2 weeks.

## 2019-08-26 ENCOUNTER — Encounter: Payer: 59 | Admitting: Podiatry

## 2019-08-31 ENCOUNTER — Encounter: Payer: Self-pay | Admitting: Podiatry

## 2019-08-31 ENCOUNTER — Other Ambulatory Visit: Payer: Self-pay

## 2019-08-31 ENCOUNTER — Ambulatory Visit (INDEPENDENT_AMBULATORY_CARE_PROVIDER_SITE_OTHER): Payer: Managed Care, Other (non HMO) | Admitting: Podiatry

## 2019-08-31 DIAGNOSIS — S86011A Strain of right Achilles tendon, initial encounter: Secondary | ICD-10-CM

## 2019-08-31 DIAGNOSIS — Z9889 Other specified postprocedural states: Secondary | ICD-10-CM

## 2019-08-31 DIAGNOSIS — M722 Plantar fascial fibromatosis: Secondary | ICD-10-CM

## 2019-08-31 NOTE — Progress Notes (Signed)
Presents today date of surgery 07/03/2019 status post EPF right Achilles tenolysis gastroc recession retrocalcaneal heel spur resection manipulation of the ankle.  He states that is doing much better and the soreness is resolving.  Objective: Vital signs are stable he is alert and oriented x3.  There is still mild edema no erythema cellulitis drainage or odor he has great plantarflexion against resistance and dorsiflexion against resistance.  He also has a well-healing wound distally.  No open lesions.  No purulence no malodor.  Assessment: Resolving insertional tenderness.  Resolving wound.  Well-healing surgical foot.  Plan: I encouraged him to get back into a pair of tennis shoes start padding this area so that he can start getting used to walking again he has good intentions and I will follow-up with him in 2 weeks

## 2019-09-16 ENCOUNTER — Encounter: Payer: Self-pay | Admitting: Podiatry

## 2019-09-16 ENCOUNTER — Ambulatory Visit (INDEPENDENT_AMBULATORY_CARE_PROVIDER_SITE_OTHER): Payer: Managed Care, Other (non HMO)

## 2019-09-16 ENCOUNTER — Ambulatory Visit (INDEPENDENT_AMBULATORY_CARE_PROVIDER_SITE_OTHER): Payer: Managed Care, Other (non HMO) | Admitting: Podiatry

## 2019-09-16 ENCOUNTER — Other Ambulatory Visit: Payer: Self-pay

## 2019-09-16 ENCOUNTER — Other Ambulatory Visit: Payer: Self-pay | Admitting: Podiatry

## 2019-09-16 DIAGNOSIS — Z9889 Other specified postprocedural states: Secondary | ICD-10-CM

## 2019-09-16 DIAGNOSIS — M7752 Other enthesopathy of left foot: Secondary | ICD-10-CM | POA: Diagnosis not present

## 2019-09-16 DIAGNOSIS — M109 Gout, unspecified: Secondary | ICD-10-CM

## 2019-09-16 DIAGNOSIS — M7662 Achilles tendinitis, left leg: Secondary | ICD-10-CM

## 2019-09-16 DIAGNOSIS — M722 Plantar fascial fibromatosis: Secondary | ICD-10-CM

## 2019-09-16 DIAGNOSIS — S86011A Strain of right Achilles tendon, initial encounter: Secondary | ICD-10-CM

## 2019-09-16 MED ORDER — INDOMETHACIN 50 MG PO CAPS: 50.0000 mg | ORAL_CAPSULE | Freq: Two times a day (BID) | ORAL | 1 refills | Status: AC

## 2019-09-16 NOTE — Progress Notes (Signed)
He presents today date of surgery 07/03/2019 status post endoscopic plantar fasciotomy and Achilles tenolysis gastroc recession retrocalcaneal heel spur resection manipulation of ankle and cast application on the right foot.  He states that his right foot is doing pretty well unless he overdoes it because he is compensating for his left foot.  His left foot is now starting to hurt it has been hurting since Saturday and he states that is really really painful right here as he points to the sinus tarsi of the left foot.  Objective: Vital signs are stable he is alert and oriented x3.  Pulses are palpable.  There is no erythema edema cellulitis drainage or odor he has pain on palpation of the distal Achilles as well as warmth red area overlying the sinus tarsi.  He does have a history of gout and this is considerably warm for just inflammation.  Assessment: Probable gout attack left.  Plan: Requesting blood work injected the sinus tarsi left with 20 mg of Kenalog 5 mg of Marcaine after sterile Betadine skin prep.  Went ahead and started him on indomethacin.  I will follow-up with him once the results come in.  Also we will see him back in a couple of weeks.  Extend his out of work paperwork.  He is in no way able to go back at this time.  I will reassess that when I see him for follow-up.  He is unable to take allopurinol due to stomach upset but he has never tried colchicine.

## 2019-09-17 LAB — RHEUMATOID FACTOR: Rhuematoid fact SerPl-aCnc: <10 IU/mL (ref 0.0–13.9)

## 2019-09-17 LAB — CBC WITH DIFFERENTIAL/PLATELET
Basophils Absolute: 0.1 10*3/uL (ref 0.0–0.2)
Basos: 1 %
EOS (ABSOLUTE): 0.1 10*3/uL (ref 0.0–0.4)
Eos: 1 %
Hematocrit: 41.4 % (ref 37.5–51.0)
Hemoglobin: 13.8 g/dL (ref 13.0–17.7)
Immature Grans (Abs): 0 10*3/uL (ref 0.0–0.1)
Immature Granulocytes: 0 %
Lymphocytes Absolute: 3 10*3/uL (ref 0.7–3.1)
Lymphs: 28 %
MCH: 27.6 pg (ref 26.6–33.0)
MCHC: 33.3 g/dL (ref 31.5–35.7)
MCV: 83 fL (ref 79–97)
Monocytes Absolute: 0.9 10*3/uL (ref 0.1–0.9)
Monocytes: 8 %
Neutrophils Absolute: 6.8 10*3/uL (ref 1.4–7.0)
Neutrophils: 62 %
Platelets: 362 10*3/uL (ref 150–450)
RBC: 5 x10E6/uL (ref 4.14–5.80)
RDW: 13 % (ref 11.6–15.4)
WBC: 10.8 10*3/uL (ref 3.4–10.8)

## 2019-09-17 LAB — SEDIMENTATION RATE: Sed Rate: 19 mm/hr — ABNORMAL HIGH (ref 0–15)

## 2019-09-17 LAB — C-REACTIVE PROTEIN: CRP: 5 mg/L (ref 0–10)

## 2019-09-17 LAB — ANA: Anti Nuclear Antibody (ANA): NEGATIVE

## 2019-09-17 LAB — URIC ACID: Uric Acid: 8 mg/dL (ref 3.8–8.4)

## 2019-09-30 ENCOUNTER — Other Ambulatory Visit: Payer: Self-pay

## 2019-09-30 ENCOUNTER — Encounter: Payer: Self-pay | Admitting: Podiatry

## 2019-09-30 ENCOUNTER — Ambulatory Visit (INDEPENDENT_AMBULATORY_CARE_PROVIDER_SITE_OTHER): Payer: Managed Care, Other (non HMO) | Admitting: Podiatry

## 2019-09-30 DIAGNOSIS — Z9889 Other specified postprocedural states: Secondary | ICD-10-CM

## 2019-09-30 MED ORDER — COLCHICINE 0.6 MG PO TABS: ORAL_TABLET | ORAL | 3 refills | Status: DC

## 2019-09-30 NOTE — Progress Notes (Signed)
He presents today states that he is doing much better.  Objective: Vital signs are stable alert and oriented x3.  Pulses are palpable.  He has minimal tenderness on palpation of the surgical foot posteriorly.  No pain on the left foot for his gout was last visit.  But lab report does demonstrate a slightly elevated uric acid level at 8.0 near the high end of an 8.4 scale.  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  Resolving surgical right foot.  Plan: We will allow him to go back to work limiting his weight capacity to 20 pounds.  Otherwise, started him on colchicine 0.6 mg 1 p.o. daily and taper him off of his Indocin.  I will follow-up with him in 1 month

## 2019-11-11 ENCOUNTER — Encounter: Payer: Self-pay | Admitting: Podiatry

## 2019-11-11 ENCOUNTER — Ambulatory Visit (INDEPENDENT_AMBULATORY_CARE_PROVIDER_SITE_OTHER): Payer: 59 | Admitting: Podiatry

## 2019-11-11 ENCOUNTER — Other Ambulatory Visit: Payer: Self-pay

## 2019-11-11 VITALS — Temp 98.0°F

## 2019-11-11 DIAGNOSIS — S86011A Strain of right Achilles tendon, initial encounter: Secondary | ICD-10-CM

## 2019-11-11 DIAGNOSIS — M722 Plantar fascial fibromatosis: Secondary | ICD-10-CM

## 2019-11-11 DIAGNOSIS — Z9889 Other specified postprocedural states: Secondary | ICD-10-CM | POA: Diagnosis not present

## 2019-11-11 NOTE — Progress Notes (Signed)
He presents today for follow-up of his gout which she states is very stable at this point he continues to take the colchicine daily.  He states that his Achilles is a little bit tender has not really bothered him too much no more so than his shoulder has.  Objective: Vital signs are stable he is alert and oriented x3 no areas of increased warmth or tenderness on the foot.  He has some mild tenderness at the surgical site the posterior aspect of the right foot at the insertion of the Achilles on the bone but other than that it is doing really well.  Pulses remain palpable good plantar flexion against resistance margins of the Achilles are palpable.  Assessment: Well-healing gouty capsulitis and Achilles tendon repair right.  Plan: Continue use of the daily colchicine since he is unable to take allopurinol.  Follow-up with him as needed

## 2020-02-09 NOTE — Progress Notes (Signed)
Scheduled to complete physical 02/17/20 with Durward Parcel, PA-C.  AMD

## 2020-02-10 ENCOUNTER — Ambulatory Visit: Payer: Self-pay

## 2020-02-10 ENCOUNTER — Other Ambulatory Visit: Payer: Self-pay

## 2020-02-10 DIAGNOSIS — Z0000 Encounter for general adult medical examination without abnormal findings: Secondary | ICD-10-CM

## 2020-02-10 LAB — POCT URINALYSIS DIPSTICK
Bilirubin, UA: NEGATIVE
Blood, UA: NEGATIVE
Glucose, UA: NEGATIVE
Ketones, UA: NEGATIVE
Leukocytes, UA: NEGATIVE
Nitrite, UA: NEGATIVE
Protein, UA: NEGATIVE
Spec Grav, UA: >=1.03 — AB (ref 1.010–1.025)
Urobilinogen, UA: 0.2 E.U./dL
pH, UA: 5.5 (ref 5.0–8.0)

## 2020-02-11 LAB — CMP12+LP+TP+TSH+6AC+CBC/D/PLT
ALT: 40 IU/L (ref 0–44)
AST: 25 IU/L (ref 0–40)
Albumin/Globulin Ratio: 1.9 (ref 1.2–2.2)
Albumin: 4.6 g/dL (ref 4.0–5.0)
Alkaline Phosphatase: 98 IU/L (ref 48–121)
BUN/Creatinine Ratio: 14 (ref 9–20)
BUN: 11 mg/dL (ref 6–20)
Basophils Absolute: 0.1 10*3/uL (ref 0.0–0.2)
Basos: 1 %
Bilirubin Total: 0.5 mg/dL (ref 0.0–1.2)
Calcium: 9.5 mg/dL (ref 8.7–10.2)
Chloride: 105 mmol/L (ref 96–106)
Chol/HDL Ratio: 5.5 ratio — ABNORMAL HIGH (ref 0.0–5.0)
Cholesterol, Total: 176 mg/dL (ref 100–199)
Creatinine, Ser: 0.81 mg/dL (ref 0.76–1.27)
EOS (ABSOLUTE): 0.2 10*3/uL (ref 0.0–0.4)
Eos: 2 %
Estimated CHD Risk: 1.2 times avg. — ABNORMAL HIGH (ref 0.0–1.0)
Free Thyroxine Index: 1.8 (ref 1.2–4.9)
GFR calc Af Amer: 132 mL/min/{1.73_m2} (ref >59)
GFR calc non Af Amer: 114 mL/min/{1.73_m2} (ref >59)
GGT: 26 IU/L (ref 0–65)
Globulin, Total: 2.4 g/dL (ref 1.5–4.5)
Glucose: 108 mg/dL — ABNORMAL HIGH (ref 65–99)
HDL: 32 mg/dL — ABNORMAL LOW (ref >39)
Hematocrit: 39.7 % (ref 37.5–51.0)
Hemoglobin: 13.5 g/dL (ref 13.0–17.7)
Immature Grans (Abs): 0 10*3/uL (ref 0.0–0.1)
Immature Granulocytes: 0 %
Iron: 112 ug/dL (ref 38–169)
LDH: 158 IU/L (ref 121–224)
LDL Chol Calc (NIH): 106 mg/dL — ABNORMAL HIGH (ref 0–99)
Lymphocytes Absolute: 2.4 10*3/uL (ref 0.7–3.1)
Lymphs: 36 %
MCH: 28.1 pg (ref 26.6–33.0)
MCHC: 34 g/dL (ref 31.5–35.7)
MCV: 83 fL (ref 79–97)
Monocytes Absolute: 0.5 10*3/uL (ref 0.1–0.9)
Monocytes: 8 %
Neutrophils Absolute: 3.5 10*3/uL (ref 1.4–7.0)
Neutrophils: 53 %
Phosphorus: 3.8 mg/dL (ref 2.8–4.1)
Platelets: 308 10*3/uL (ref 150–450)
Potassium: 4.7 mmol/L (ref 3.5–5.2)
RBC: 4.8 x10E6/uL (ref 4.14–5.80)
RDW: 13.5 % (ref 11.6–15.4)
Sodium: 141 mmol/L (ref 134–144)
T3 Uptake Ratio: 25 % (ref 24–39)
T4, Total: 7 ug/dL (ref 4.5–12.0)
TSH: 1.52 u[IU]/mL (ref 0.450–4.500)
Total Protein: 7 g/dL (ref 6.0–8.5)
Triglycerides: 216 mg/dL — ABNORMAL HIGH (ref 0–149)
Uric Acid: 7.9 mg/dL (ref 3.8–8.4)
VLDL Cholesterol Cal: 38 mg/dL (ref 5–40)
WBC: 6.7 10*3/uL (ref 3.4–10.8)

## 2020-02-17 ENCOUNTER — Other Ambulatory Visit: Payer: Self-pay

## 2020-02-17 ENCOUNTER — Ambulatory Visit: Payer: Self-pay | Admitting: Physician Assistant

## 2020-02-17 ENCOUNTER — Encounter: Payer: Self-pay | Admitting: Physician Assistant

## 2020-02-17 VITALS — BP 123/81 | HR 84 | Temp 98.2°F | Resp 12 | Ht 72.0 in | Wt 214.0 lb

## 2020-02-17 DIAGNOSIS — Z0000 Encounter for general adult medical examination without abnormal findings: Secondary | ICD-10-CM

## 2020-02-17 NOTE — Progress Notes (Signed)
   Subjective: Annual physical exam    Patient ID: Travis Carpenter, male    DOB: 1981/01/27, 39 y.o.   MRN: [id number]  HPI Patient presents annual physical exam.  Patient voices no concerns or complaints.   Review of Systems    Gout Objective:   Physical Exam  No acute distress.  HEENT is unremarkable.  Neck is supple for adenopathy or bruits.  Lungs are clear to auscultation.  Heart is regular rate and rhythm.  Abdomen with negative HSM, normoactive bowel sounds, soft, and nontender to palpation.  No obvious deformity to the upper or lower extremities.  Patient has full and equal range of motion of the upper and lower extremities.  No obvious deformity to cervical or lumbar spine.  Patient has full and equal range of motion of the cervical lumbar spine.  Cranial nerves II through XII grossly intact.      Assessment & Plan: Well exam.  Discussed lab results showing elevated cholesterol and decreased HDL.  Patient elected to try lifestyle modifications.  Advised follow-up in 6 months.

## 2020-07-13 ENCOUNTER — Ambulatory Visit: Payer: Self-pay

## 2020-07-13 DIAGNOSIS — Z23 Encounter for immunization: Secondary | ICD-10-CM

## 2020-10-06 ENCOUNTER — Ambulatory Visit: Payer: 59

## 2021-01-03 ENCOUNTER — Ambulatory Visit
Admission: RE | Admit: 2021-01-03 | Discharge: 2021-01-03 | Disposition: A | Discharge status: Home / Self Care | Payer: No Typology Code available for payment source | Attending: Physician Assistant | Admitting: Physician Assistant

## 2021-01-03 ENCOUNTER — Other Ambulatory Visit: Payer: Self-pay

## 2021-01-03 ENCOUNTER — Ambulatory Visit
Admission: RE | Admit: 2021-01-03 | Discharge: 2021-01-03 | Disposition: A | Discharge status: Home / Self Care | Payer: No Typology Code available for payment source | Source: Ambulatory Visit | Attending: Physician Assistant | Admitting: Physician Assistant

## 2021-01-03 ENCOUNTER — Ambulatory Visit: Payer: Self-pay | Admitting: Physician Assistant

## 2021-01-03 VITALS — BP 144/91 | HR 104 | Temp 98.6°F | Resp 14 | Ht 72.0 in | Wt 210.0 lb

## 2021-01-03 DIAGNOSIS — S93402A Sprain of unspecified ligament of left ankle, initial encounter: Secondary | ICD-10-CM

## 2021-01-03 NOTE — Progress Notes (Signed)
   Subjective: Left ankle pain    Patient ID: Travis Carpenter, male    DOB: December 30, 1980, 40 y.o.   MRN: [id number]  HPI Patient presents with left lateral ankle pain secondary to inversion incident yesterday at work.  Patient stated pain increased with weightbearing and ambulation.  Denies loss of sensation.  Ambulates with discomfort.  Rates pain as a 5/10.  Described pain as "achy".  No palliative measures prior to arrival.   Review of Systems    Multijoint arthralgia Objective:   Physical Exam temperature 98.6, pulse 104, respiration 14, BP 144/91, patient 98% O2 sat on room air. Mild distress.  Ambulate atypical gait.  No obvious deformity to the left ankle.  Mild edema to the lateral malleolus this.  Neurovascular intact.       Assessment & Plan: Left ankle sprain versus fracture  Patient placed in a ankle support and will get an outpatient x-ray today.  Advise over-the-counter anti-inflammatory medication.  Patient return back in the morning for reevaluation.

## 2021-01-04 ENCOUNTER — Encounter: Payer: Self-pay | Admitting: Physician Assistant

## 2021-01-04 ENCOUNTER — Ambulatory Visit: Payer: Self-pay | Admitting: Physician Assistant

## 2021-01-04 VITALS — BP 128/75 | HR 85 | Temp 98.0°F | Resp 14 | Ht 72.0 in | Wt 210.0 lb

## 2021-01-04 DIAGNOSIS — S93402A Sprain of unspecified ligament of left ankle, initial encounter: Secondary | ICD-10-CM

## 2021-01-04 NOTE — Progress Notes (Signed)
Pt presents today to follow up on left ankle injury at work. CL,RMA

## 2021-01-04 NOTE — Progress Notes (Signed)
   Subjective: Left ankle sprain    Patient ID: Travis Carpenter, male    DOB: [birth year], 40 y.o.   MRN: [id number]  HPI Patient returns for follow-up of left ankle pain and edema secondary to a twisting incident.   Patient had x-rays performed yesterday is here for reevaluation.   Patient states slight improvement with splinting and anti-inflammatory medications. Review of Systems Left ankle pain    Objective:   Physical Exam No acute distress.  Ambulates with foot slight atypical gait.  No obvious deformity to the left ankle. Reviewed x-rays which were negative for fracture.       Assessment & Plan: Left ankle sprain.  Patient placed on modified duty.  Patient advised to wear his ankle splint for the next 3 to 5 days.   Continue over-the-counter anti-inflammatory medications.  Return in 5 days for reevaluation for full duties.

## 2021-01-10 ENCOUNTER — Other Ambulatory Visit: Payer: Self-pay

## 2021-01-10 ENCOUNTER — Encounter: Payer: Self-pay | Admitting: Nurse Practitioner

## 2021-01-10 ENCOUNTER — Ambulatory Visit: Payer: 59

## 2021-01-10 ENCOUNTER — Ambulatory Visit: Payer: Self-pay | Admitting: Nurse Practitioner

## 2021-01-10 VITALS — BP 138/90 | Temp 98.4°F | Resp 14 | Ht 72.0 in | Wt 210.0 lb

## 2021-01-10 DIAGNOSIS — S93402D Sprain of unspecified ligament of left ankle, subsequent encounter: Secondary | ICD-10-CM

## 2021-01-10 NOTE — Progress Notes (Signed)
   Subjective:    Patient ID: Travis Carpenter, male    DOB: 1981/03/11, 40 y.o.   MRN: [id number]  HPI  40 year old male presenting to [facility] for follow up regarding injury to left ankle sprain. XRAY performed 01/03/21 without evidence of fracture noted below.   Has been back at work since 01/06/21.   Here for follow up and recommendations.   Pain today is more in midfoot than at ankle.  Denies swelling, wears supportive boots at work does not wear shoes at home. Has been using ibuprofen as needed  Today's Vitals   01/10/21 1346  BP: 138/90  Resp: 14  Temp: 98.4 F (36.9 C)  Weight: 210 lb (95.3 kg)  Height: 6' (1.829 m)   Body mass index is 28.48 kg/m.  Review of Systems  Constitutional: Negative.   HENT: Negative.   Musculoskeletal: Positive for myalgias. Negative for gait problem.  Skin: Negative.   Neurological: Negative.        Objective:   Physical Exam Musculoskeletal:     Left foot: Normal range of motion. Tenderness present. No swelling or deformity.       Legs:     Comments: Tenderness to highlighted area without swelling or bruising. ROM intact. No pain with movement of toes.   Neurological:     Mental Status: He is alert.       CLINICAL DATA:  Left lateral pain and edema secondary to twisting injury.  EXAM: LEFT ANKLE COMPLETE - 3+ VIEW  COMPARISON:  03/31/2013  FINDINGS: There is no evidence of fracture, dislocation, or joint effusion. Ankle mortise is preserved. There is a small plantar calcaneal spur and Achilles tendon enthesophyte. Minimal lateral soft tissue edema.  IMPRESSION: No acute fracture or subluxation of the left ankle.   Electronically Signed   By: Narda Rutherford M.D.   On: 01/05/2021 19:06    Assessment & Plan:   May Return to work as patient has already been working since 01/06/21 without worsening symptoms.   Wrapped foot and ankle with mediwrap and instructed to keep wrapped while at work and home may take off  prior to sleep.   Advised shoes in house to help with support as well.  May continue ibuprofen as needed and RTC if pain persists or with new concerns

## 2021-01-19 ENCOUNTER — Encounter: Payer: Self-pay | Admitting: Physician Assistant

## 2021-01-19 ENCOUNTER — Other Ambulatory Visit: Payer: Self-pay

## 2021-01-19 ENCOUNTER — Ambulatory Visit: Payer: Self-pay | Admitting: Physician Assistant

## 2021-01-19 VITALS — BP 142/89 | HR 84 | Temp 99.1°F | Resp 14 | Ht 72.0 in | Wt 210.0 lb

## 2021-01-19 DIAGNOSIS — S93402D Sprain of unspecified ligament of left ankle, subsequent encounter: Secondary | ICD-10-CM

## 2021-01-19 NOTE — Progress Notes (Signed)
   Subjective: Sprain left ankle    Patient ID: Travis Carpenter, male    DOB: 03-Aug-1981, 40 y.o.   MRN: [id number]  HPI Patient presents with continued left ankle pain secondary to a sprain which occurredOn 01/03/2021.  Patient states pain has increased in the past 3 days.  Continue to have edema to the lateral aspect of the left foot.   Review of Systems    As above Objective:   Physical Exam No acute distress.  Ambulates with atypical gait.  Wear ankle splint.  Examination reveals no obvious deformity.  Moderate guarding palpation dorsal lateral aspect left foot.  Full and equal range of motion nonweightbearing.  Neurovascular intact.  Moderate guarding palpation inferior aspect of the calcaneus.       Assessment & Plan: Left ankle sprain.  Reviewed x-ray reports again showing no bony abnormalities.  Patient has a small calcaneal spur. Patient referred to podiatry for definitive evaluation and treatment.

## 2021-01-19 NOTE — Addendum Note (Signed)
Addended by: Gardner Candle on: 01/19/2021 02:54 PM   Modules accepted: Orders

## 2021-01-19 NOTE — Progress Notes (Signed)
Pt stated when he woke up yesterday the pain worsened and it hurts worse now then it did yesterday. Pain scale 5 out of 10. Pt also states he starting feel pain where heel and sole of foot begins. CL,RMA

## 2021-01-19 NOTE — Addendum Note (Signed)
Addended by: Gardner Candle on: 01/19/2021 11:25 AM   Modules accepted: Orders

## 2021-01-20 NOTE — Addendum Note (Signed)
Addended by: Gardner Candle on: 01/20/2021 02:10 PM   Modules accepted: Orders

## 2021-01-25 ENCOUNTER — Other Ambulatory Visit: Payer: Self-pay

## 2021-01-25 ENCOUNTER — Ambulatory Visit (INDEPENDENT_AMBULATORY_CARE_PROVIDER_SITE_OTHER): Payer: No Typology Code available for payment source | Admitting: Podiatry

## 2021-01-25 ENCOUNTER — Ambulatory Visit (INDEPENDENT_AMBULATORY_CARE_PROVIDER_SITE_OTHER): Payer: No Typology Code available for payment source

## 2021-01-25 ENCOUNTER — Encounter: Payer: Self-pay | Admitting: Podiatry

## 2021-01-25 ENCOUNTER — Other Ambulatory Visit: Payer: Self-pay | Admitting: Podiatry

## 2021-01-25 DIAGNOSIS — S9032XA Contusion of left foot, initial encounter: Secondary | ICD-10-CM

## 2021-01-25 DIAGNOSIS — Z87828 Personal history of other (healed) physical injury and trauma: Secondary | ICD-10-CM

## 2021-01-25 DIAGNOSIS — S93602A Unspecified sprain of left foot, initial encounter: Secondary | ICD-10-CM

## 2021-01-25 MED ORDER — MELOXICAM 15 MG PO TABS: 15.0000 mg | ORAL_TABLET | Freq: Every day | ORAL | 3 refills | Status: DC

## 2021-01-25 MED ORDER — METHYLPREDNISOLONE 4 MG PO TBPK: ORAL_TABLET | ORAL | 0 refills | Status: DC

## 2021-01-25 NOTE — Progress Notes (Signed)
He presents today after having not seen him for a year with a chief complaint of an injury to his left ankle and foot date of injury Jan 02, 2021 with pain along the lateral aspect of the ankle and the foot and some of the medial ankle.  He said he saw Dr. From work and was told to use crutches.  An x-ray was taken which was deemed negative.  But he states that he cannot bear weight on it without it hurting.  He states that the injury came about as he was and did not see a piece of concrete and tripped over it.  He states he had an inversion injury as he demonstrates his foot rolling.  Objective: Vital signs are stable alert and oriented x3.  Pulses are palpable.  There is no erythema edema cellulitis drainage or odor pain on palpation along the peroneal tendons of the fifth met base.  He also has pain on palpation of the fourth fifth tarsometatarsal joint and the cuboid bone.  Radiographs of the foot taken in combination with the ankle views previously taken currently do not demonstrate any significant osseous abnormalities though I cannot be sure that there is not a crack in the cuboid.  Assessment: Peroneal tendinitis stress and strain most likely of those tendons cannot rule out a fracture of the cuboid.  Plan: Placed him in a Tri-Lock brace start him on anti-inflammatories and I will follow-up with him in about 3 weeks if not improved we will consider MRI.

## 2021-02-15 ENCOUNTER — Ambulatory Visit (INDEPENDENT_AMBULATORY_CARE_PROVIDER_SITE_OTHER): Payer: No Typology Code available for payment source | Admitting: Podiatry

## 2021-02-15 ENCOUNTER — Other Ambulatory Visit: Payer: Self-pay

## 2021-02-15 ENCOUNTER — Encounter: Payer: Self-pay | Admitting: Podiatry

## 2021-02-15 DIAGNOSIS — M76822 Posterior tibial tendinitis, left leg: Secondary | ICD-10-CM

## 2021-02-15 NOTE — Progress Notes (Signed)
He presents today states that his foot is doing pretty good relates overall is about 80 to 85% improved at this point.  Objective: Still has tenderness on palpation of the posterior tibial tendon with some fluctuance within the tendon sheath.  He has no pain on palpation of the peroneal tendons.  Pulses remain palpable no ecchymosis no erythema cellulitis drainage or odor.  Assessment: Resolving stress and strain to the posterior tibial tendon and peroneal tendons and ankle.  Plan: Continue the use of his Tri-Lock brace and follow-up with me in about 3 weeks if not improved an MRI will be necessary.

## 2021-03-08 ENCOUNTER — Telehealth: Payer: Self-pay | Admitting: *Deleted

## 2021-03-08 ENCOUNTER — Ambulatory Visit: Payer: 59 | Admitting: Podiatry

## 2021-03-08 NOTE — Telephone Encounter (Signed)
"  I need the lastest note for [employer].  I think it was 03/01/2021, I'm not sure.  Fax me those notes to [phone] or if you need to give me a call, you can do so at [phone]."   Medical records were faxed to [facility] as requested.

## 2021-04-03 ENCOUNTER — Ambulatory Visit: Payer: 59 | Admitting: Podiatry

## 2021-05-15 ENCOUNTER — Encounter: Payer: Self-pay | Admitting: Physician Assistant

## 2021-05-15 ENCOUNTER — Ambulatory Visit: Payer: Self-pay | Admitting: Physician Assistant

## 2021-05-15 ENCOUNTER — Other Ambulatory Visit: Payer: Self-pay

## 2021-05-15 VITALS — Resp 12 | Ht 72.0 in | Wt 210.0 lb

## 2021-05-15 DIAGNOSIS — S93402D Sprain of unspecified ligament of left ankle, subsequent encounter: Secondary | ICD-10-CM

## 2021-05-15 NOTE — Progress Notes (Signed)
   Subjective: Left ankle pain    Patient ID: Travis Carpenter, male    DOB: 14-Apr-1981, 40 y.o.   MRN: [id number]  HPI Patient is follow-up for left ankle pain which occurred on 01/02/2021.  Patient was referred to podiatry secondary to negative x-ray findings.  Patient was diagnosed with peroneal tendinitis stress and strain most likely of the tendons.  Patient was placed in a Tri-Lock brace and started on anti-inflammatory medication.  Patient went on FMLA secondary to a family emergency has not been followed in the last 2 months.  Patient states pain has decreased and he is ready return back to full duties.    Review of Systems Negative except for complaint.    Objective:   Physical Exam No acute distress.  Patient ambulates with normal gait.  No obvious deformity to the left ankle.  No guarding palpation.  Patient has full and equal l range of motion of the left ankle.       Assessment & Plan: Left ankle sprain.   Patient returned back to trauma full duties.  Advised the patient podiatrist has recommended an MRI if complaint persist.

## 2021-08-21 DIAGNOSIS — N401 Enlarged prostate with lower urinary tract symptoms: Secondary | ICD-10-CM | POA: Insufficient documentation

## 2022-01-19 DIAGNOSIS — S86919A Strain of unspecified muscle(s) and tendon(s) at lower leg level, unspecified leg, initial encounter: Secondary | ICD-10-CM | POA: Insufficient documentation

## 2022-02-14 DIAGNOSIS — M25661 Stiffness of right knee, not elsewhere classified: Secondary | ICD-10-CM | POA: Insufficient documentation

## 2022-05-30 ENCOUNTER — Encounter: Payer: Self-pay | Admitting: Physician Assistant

## 2022-05-30 ENCOUNTER — Ambulatory Visit: Payer: Self-pay | Admitting: Physician Assistant

## 2022-05-30 VITALS — BP 124/73 | HR 64 | Temp 98.2°F | Resp 14 | Ht 72.0 in | Wt 217.0 lb

## 2022-05-30 DIAGNOSIS — M25561 Pain in right knee: Secondary | ICD-10-CM

## 2022-05-30 NOTE — Progress Notes (Signed)
   Subjective: Right knee pain    Patient ID: Travis Carpenter, male    DOB: April 21, 1981, 41 y.o.   MRN: [id number]  HPI Patient presents for return to work evaluation.  Patient developed right knee pain secondary to stepping in a hole in May 2023.  Patient has completed physical therapy to include functional capacity evaluation.  Patient was given a 6% impairment and has reached maximal medical improvement.   Review of Systems Negative except for chief complaint    Objective:   Physical Exam BP is 124/73, pulse 64, respiration 14, temperature 98.2, patient 99% O2 sat on room air.  Patient weighs 217 pounds and BMI is 29.43. Patient ambulates with normal gait.  No obvious deformity to the right knee.  Patient has full and equal  range of motion of the right knee.  Patient strength against resistance 5/5.       Assessment & Plan: Right knee pain   Patient return back to full duties.

## 2022-05-30 NOTE — Progress Notes (Signed)
Pt presents today for WC f/u injury date 511-23. Needs rtw note.

## 2022-06-13 ENCOUNTER — Ambulatory Visit: Payer: 59

## 2022-06-13 ENCOUNTER — Encounter: Payer: Self-pay | Admitting: Podiatry

## 2022-06-13 ENCOUNTER — Ambulatory Visit (INDEPENDENT_AMBULATORY_CARE_PROVIDER_SITE_OTHER): Payer: No Typology Code available for payment source | Admitting: Podiatry

## 2022-06-13 DIAGNOSIS — S9032XA Contusion of left foot, initial encounter: Secondary | ICD-10-CM | POA: Diagnosis not present

## 2022-06-14 NOTE — Progress Notes (Signed)
He presents today with pain around the first metatarsophalangeal joint left foot.  Date of injury was 06/06/2022 played softball last week with his 46 from work after having just been released to go back to work from a knee surgery.  Initially the toe was very swollen and painful he does have a history of gout it was not nearly as painful as the gout was.  So he thinks it must of been an injury.  He states that initially the pain was on the bottom but now the pain is on the top he went to [facility] and had an x-ray is been wearing the boot.  He was started on prednisone and he brought his x-rays with him today for review.  Objective: Vital signs are stable alert and x3 the left hallux and metatarsophalangeal joint does not demonstrate any erythema edema cellulitis drainage or odor to some tenderness on palpation and range of motion of the joint he does have some tenderness plantarly but the majority of his pain is dorsally at the joint level.  Radiographs reviewed today do not demonstrate any type of fractures.  No acute findings visible.  Assessment: Contusion or jamming of the toe cannot rule out fracture.  Plan: Discussed etiology pathology conservative therapies at this point I highly recommended he come back in a week after wearing his cam boot and being out of work.  I would like to get sesamoid axial views and then 3 regular views.  And reevaluate this.  If not improved consider MRI.

## 2022-06-20 ENCOUNTER — Ambulatory Visit (INDEPENDENT_AMBULATORY_CARE_PROVIDER_SITE_OTHER): Payer: 59 | Admitting: Podiatry

## 2022-06-20 ENCOUNTER — Ambulatory Visit (INDEPENDENT_AMBULATORY_CARE_PROVIDER_SITE_OTHER): Payer: 59

## 2022-06-20 ENCOUNTER — Encounter: Payer: Self-pay | Admitting: *Deleted

## 2022-06-20 ENCOUNTER — Encounter: Payer: Self-pay | Admitting: Podiatry

## 2022-06-20 DIAGNOSIS — S9032XA Contusion of left foot, initial encounter: Secondary | ICD-10-CM | POA: Diagnosis not present

## 2022-06-20 DIAGNOSIS — M25872 Other specified joint disorders, left ankle and foot: Secondary | ICD-10-CM

## 2022-06-20 DIAGNOSIS — T148XXA Other injury of unspecified body region, initial encounter: Secondary | ICD-10-CM | POA: Diagnosis not present

## 2022-06-20 NOTE — Patient Instructions (Signed)
Joint capsule tear

## 2022-06-20 NOTE — Progress Notes (Signed)
Presents today for follow-up of his painful first metatarsophalangeal joint left foot.  States that he really has not improved a whole lot since his last visit.  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  Objective: Vital signs are stable he is alert oriented x3.  Pulses are palpable.  There is no erythema edema cellulitis drainage or odor he has marked tenderness on palpation and medial lateral compression of the proximal phalanx tenderness on palpation and range of motion of the first metatarsal phalangeal joint at the base of the proximal phalanx and deep within the first metatarsal phalangeal joint he also has tenderness on palpation of the sesamoids.  No pain on palpation the medial aspect of the first metatarsophalangeal joint and there is no warmth on palpation.  Radiographs taken today do not demonstrate any type of osseous abnormalities does not appear to be a fracture with any dislocation or stress fracture at this point.  Sesamoids appear to be intact  Assessment: Cannot rule out bone edema which would be painful in that area.  Cannot rule out arthritis associated with previous gout attacks.  Cannot rule out a capsular tear.  Plan: We will request MRI for capsular tear of the first metatarsophalangeal joint of the left foot all conservative therapies multiple physicians and treatment plans have failed to render this patient asymptomatic.

## 2022-06-21 DIAGNOSIS — M79676 Pain in unspecified toe(s): Secondary | ICD-10-CM

## 2022-07-06 ENCOUNTER — Encounter: Payer: Self-pay | Admitting: Podiatry

## 2022-07-07 ENCOUNTER — Other Ambulatory Visit: Payer: Self-pay

## 2022-07-12 ENCOUNTER — Telehealth: Payer: Self-pay | Admitting: Podiatry

## 2022-07-12 NOTE — Telephone Encounter (Signed)
Pts wife called asking to talk to the manager about an issue. I explained our office admin is out until next week. I asked if I could take a message and pass it on to the nurse manager and she was ok with that.  She is upset because her husband is out on fmla until he gets an mri that Dr Al Corpus requested 10.18 and it was scheduled for 11.4 at [facility] and they called the day before to cancel the appt as they did not have authorization and they had 2 wks to get it authorized./ Pts wife  stated she works in a doctors office and knows this should have been done. There was plenty of time to get it done before the appt. She is now concerned that pt is going to run out of fmla before they can get the mri and be seen by the provider especially if anything else needs to be done. Please call pts wife to discuss further.

## 2022-07-13 NOTE — Telephone Encounter (Signed)
Called and spoke with [employer],prior authorization was submitted on Nov.3rd, (case # [id number] on hold ,provided that 6 weeks of direct treatment within last 12 weeks.  Faxing over the clinicals (only seen two times,  06/2022).We will receive an answer within in 2 business days after receiving clinicals notes. They will notify patient and office in writing of decision.  Contact:Joyce R

## 2022-07-13 NOTE — Telephone Encounter (Signed)
Called wife back to address the concerns voiced. Unable to reach her. Left voicemail with callback number. Spoke with Ammie who handles prior authorizations for Dr. Al Corpus. Asked to start the prior authorization so patient can have MRI done as soon as possible to not further delay patient. Ammie is currently working on the prior authorization. Will notify patient or wife when confirmation of completion is received.

## 2022-07-13 NOTE — Telephone Encounter (Signed)
Patient called back, I notified him that Ammie will be calling him back about his prior auth when it has been completed

## 2022-07-31 ENCOUNTER — Telehealth: Payer: Self-pay | Admitting: Podiatry

## 2022-07-31 NOTE — Telephone Encounter (Signed)
Needs another appt.

## 2022-07-31 NOTE — Telephone Encounter (Signed)
Travis Carpenter called to follow up on the MRI that was denied by the [employer]. He would like a callback to let him know the next steps as far as the MRI. I think they needed a Peer to Peer.

## 2022-08-01 NOTE — Telephone Encounter (Signed)
Pt called back and is scheduled on 12/5 at 315 pm with Dr Al Corpus.

## 2022-08-06 ENCOUNTER — Encounter: Payer: Self-pay | Admitting: Podiatry

## 2022-08-06 ENCOUNTER — Ambulatory Visit (INDEPENDENT_AMBULATORY_CARE_PROVIDER_SITE_OTHER): Payer: 59 | Admitting: Podiatry

## 2022-08-06 ENCOUNTER — Ambulatory Visit (INDEPENDENT_AMBULATORY_CARE_PROVIDER_SITE_OTHER): Payer: 59

## 2022-08-06 VITALS — BP 121/79 | HR 111

## 2022-08-06 DIAGNOSIS — M25872 Other specified joint disorders, left ankle and foot: Secondary | ICD-10-CM

## 2022-08-06 NOTE — Progress Notes (Signed)
He presents today for follow-up nearly 2 months status post injury first metatarsophalangeal joint of his left foot.  States that it is minimally improved even while wearing the cam boot.  States that he is not able to put a regular shoe on it is too painful and he cannot go back to work until he can wear regular shoe.  He states that nothing seems to be working.  Objective: Vital signs are stable he is alert and oriented x 3.  Still has severe pain on palpation of the sesamoid apparatus of the first metatarsal phalangeal joint left.  He has tenderness on range of motion of the toe specifically of the sesamoids.  Swelling is noted no ecchymosis.  Radiographs taken today demonstrate worsening of his sesamoids more consistent with trauma osteoarthritis or even avascular necrosis or equally as painful as fracture.  Assessment: Acute trauma in October leading to chronic pain of the first metatarsophalangeal joint.  Initial MRI request denied.  Plan: Requesting stat MRI due to failure of conservative therapy to alleviate this patient's symptomatology.  Requesting a stat MRI due to the fact that it has been long enough at this point and the pain is severe and we are running out of time to get this fixed.

## 2022-08-07 ENCOUNTER — Telehealth: Payer: Self-pay | Admitting: *Deleted

## 2022-08-07 NOTE — Telephone Encounter (Signed)
The coordinator said that looking at the notes in epic, nothing showing any conservative treatment,injections noted, will probably deny the order again.

## 2022-08-07 NOTE — Telephone Encounter (Signed)
Just let me know why they have made that denial. Thanks Ammie

## 2022-08-07 NOTE — Telephone Encounter (Signed)
Patient is calling to let the physician know that the MRI was denied again per [employer]. Called and spoke with [name] w/ [employer] prior authorization coordinator, said that it was cancelled,no notes entered as to why, will investigate and call back. Patient has been updated.

## 2022-08-10 ENCOUNTER — Other Ambulatory Visit: Payer: Self-pay | Admitting: Physician Assistant

## 2022-08-10 ENCOUNTER — Other Ambulatory Visit: Payer: Self-pay

## 2022-08-10 ENCOUNTER — Ambulatory Visit
Admission: RE | Admit: 2022-08-10 | Discharge: 2022-08-10 | Disposition: A | Discharge status: Home / Self Care | Payer: 59 | Source: Ambulatory Visit | Attending: Physician Assistant | Admitting: Physician Assistant

## 2022-08-10 DIAGNOSIS — R1031 Right lower quadrant pain: Secondary | ICD-10-CM | POA: Insufficient documentation

## 2022-08-15 ENCOUNTER — Encounter: Payer: Self-pay | Admitting: Podiatry

## 2022-08-15 ENCOUNTER — Telehealth: Payer: Self-pay | Admitting: *Deleted

## 2022-08-15 NOTE — Telephone Encounter (Signed)
Patient is calling to let the physician know that his insurance has denied the MRI for a second time, would like to know the next step moving forward, his FMLA runs out on the 28 th. Spoke with [employer] Gearldine Bienenstock) and she will reach back out to the authorization team for more information. Please advise.

## 2022-08-15 NOTE — Telephone Encounter (Signed)
Patient has been updated.

## 2022-08-15 NOTE — Telephone Encounter (Signed)
Let the patient know that [employer] never submitted the most recent office notes on 12/4, so they are going to do that and see if that changes the outcome.

## 2022-08-20 ENCOUNTER — Telehealth: Payer: Self-pay | Admitting: *Deleted

## 2022-08-20 NOTE — Telephone Encounter (Signed)
Called [employer] and a prior authorization has to be done thru insurance company directly,and requested  as an expedited appeal, a form will be sent  today from their office, complete and resend to : [phone] respond within 48-72 hours. We had 45 days after denial to appeal, A new order can only be submitted  after that period which is Jan. 2nd. I had given the denial letter to [place] back in Nov.

## 2022-08-20 NOTE — Telephone Encounter (Signed)
I called the insurance company directly and they are supposed to be faxing over a form to be completed to expedited, still waiting on that fax, yes will have to wait until Jan. 2 nd to resubmit the new MRI.

## 2022-08-20 NOTE — Telephone Encounter (Signed)
So do we need to wait until after January to reorder the MRI and submit new information?

## 2022-08-20 NOTE — Telephone Encounter (Signed)
Patient has been updated.

## 2022-08-21 ENCOUNTER — Telehealth: Payer: Self-pay | Admitting: *Deleted

## 2022-08-21 NOTE — Telephone Encounter (Signed)
Faxed the expedited appeal form to : [facility] provider resolution team [phone] received 08/21/22.

## 2022-08-28 ENCOUNTER — Telehealth: Payer: Self-pay

## 2022-08-30 ENCOUNTER — Encounter: Payer: Self-pay | Admitting: Podiatry

## 2022-08-30 NOTE — Telephone Encounter (Signed)
No further evaluation is need. Patient letter has been created and patient has been scheduled for his follow up visit.

## 2022-08-31 ENCOUNTER — Encounter: Payer: Self-pay | Admitting: Podiatry

## 2022-08-31 ENCOUNTER — Telehealth: Payer: Self-pay | Admitting: Podiatry

## 2022-08-31 NOTE — Telephone Encounter (Signed)
Patient came into [facility] office we wrote him a note for work but he came back because his work needs specific restrictions on what he can and cannot do.

## 2022-09-10 NOTE — Telephone Encounter (Signed)
Called pt he states he didn't need anything else as of now. I told him to call us if he needs anything else.

## 2022-09-11 ENCOUNTER — Ambulatory Visit (INDEPENDENT_AMBULATORY_CARE_PROVIDER_SITE_OTHER): Payer: 59 | Admitting: Podiatry

## 2022-09-11 ENCOUNTER — Encounter: Payer: Self-pay | Admitting: Podiatry

## 2022-09-11 ENCOUNTER — Telehealth: Payer: Self-pay | Admitting: *Deleted

## 2022-09-11 ENCOUNTER — Ambulatory Visit: Payer: 59 | Admitting: Podiatry

## 2022-09-11 DIAGNOSIS — M25872 Other specified joint disorders, left ankle and foot: Secondary | ICD-10-CM

## 2022-09-11 NOTE — Telephone Encounter (Signed)
Sent this message to [name] at [facility] asking if we could get his MRI scheduled now.

## 2022-09-11 NOTE — Telephone Encounter (Signed)
[employer] and spoke with Terrial Rhodes, patient's current insurance in no longer active but there was a turn around on 08/28/22,covering the study ([id number]), approval # [id number] fax the letter within 24-48 hours.

## 2022-09-11 NOTE — Telephone Encounter (Signed)
Per [name]:  But if the coverage is no longer active, it would have had to been done while it was active.  The authorization don't get it paid, the coverage has to be active at time of service.   Ammie- can you ask the patient is he can get a couple of months of [employer] through his job long enough to get his MRI, so he will have active coverage at the date of service.

## 2022-09-11 NOTE — Progress Notes (Signed)
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  He was recently denied another MRI.  However he goes on to state that on the 26th the appeal was overturned and on the 28th he received a letter stating that the MRI could be performed.  However his insurance ended on December 27.  He states that his foot is some better but would be limited at work and he has sent the letter him that we sent him with the suggestions of limitations.  Objective: Vital signs stable he is alert and oriented x 3 fibular sesamoid is still painful there is no erythema edema salines drainage odor has good dorsiflexion plantarflexion has tenderness on the plantar aspect with dorsiflexion passively as well as actively.  Assessment: At this point after this extended amount of time either we have a nonunion of a fracture of the sesamoid or an avascular necrosis of the sesamoid most likely.  Plan: Since we do not have an MRI it has been against my better judgment to give him an injection however I do think that to help alleviate his pain and at this point since he has no insurance and injections go to be his best option.  I did recommend that he continue FMLA as long as he can and asked for reinstatement of his insurance.  I recommended that he try Cobra just long enough to get the MRI done and then again we would have to consider surgical intervention should the MRI deem this necessary.

## 2022-09-12 ENCOUNTER — Encounter: Payer: Self-pay | Admitting: Podiatry

## 2022-09-12 ENCOUNTER — Telehealth: Payer: Self-pay | Admitting: *Deleted

## 2022-09-12 NOTE — Telephone Encounter (Signed)
Can you adjust the FMLA restrictions to light duty

## 2022-09-12 NOTE — Telephone Encounter (Signed)
Patient is calling because  a letter was sent to his employer stating that he is to remain out of work until 10/25/22,was not discussed during visit. His employer was working on getting him something light duty, hoping that he could do this, did not want to be out that long with no pay. Is going to reach back out to them to update on the light duty, will need another letter resent if they can accommodate, will call us back.

## 2022-09-13 ENCOUNTER — Telehealth: Payer: Self-pay | Admitting: *Deleted

## 2022-09-13 NOTE — Telephone Encounter (Signed)
Spoke with patient and he said that Deanna Artis is expensive,will call [employer] to see how much a policy would cost not working, will call us back.

## 2022-09-13 NOTE — Telephone Encounter (Signed)
Called and spoke with [employer](Marco)-ref# :[id number], and he said that even though patient's insurance had expired on 08/29/22, there was a turnaround approval period which was 08/28/22, approval [id number]) was 08/28/22, should allow the patient to have the study.   Spoke with [employer] giving them this information and they will double check with their authorization dept and call patient to schedule if this is true.

## 2022-09-24 ENCOUNTER — Other Ambulatory Visit: Payer: Self-pay

## 2022-10-01 ENCOUNTER — Telehealth: Payer: Self-pay | Admitting: *Deleted

## 2022-10-01 NOTE — Telephone Encounter (Signed)
Patient is calling to asking for a letter asking for his work limitations for his  job, please advise

## 2022-10-02 ENCOUNTER — Ambulatory Visit: Payer: 59 | Admitting: Podiatry

## 2022-10-02 NOTE — Telephone Encounter (Signed)
A note was written approximately a month ago for patient with restrictions. Just FYI

## 2022-10-09 NOTE — Telephone Encounter (Signed)
Patient is calling because he has gotten  re instated of his insurance after talking to finance dept, would like to try to get his MRI approved. Has an upcoming appointment on tomorrow, will discuss with the doctor in detail

## 2022-10-10 ENCOUNTER — Ambulatory Visit (INDEPENDENT_AMBULATORY_CARE_PROVIDER_SITE_OTHER): Payer: 59 | Admitting: Podiatry

## 2022-10-10 ENCOUNTER — Encounter: Payer: Self-pay | Admitting: Podiatry

## 2022-10-10 ENCOUNTER — Other Ambulatory Visit: Payer: Self-pay

## 2022-10-10 DIAGNOSIS — M25872 Other specified joint disorders, left ankle and foot: Secondary | ICD-10-CM

## 2022-10-10 NOTE — Telephone Encounter (Signed)
Great.  Thank you.

## 2022-10-10 NOTE — Progress Notes (Signed)
He presents today for a postop visit he states that his foot may be fraction better than it was before but continues to wear his cam walker at all times.  Like to be able to get back to work if he can.  States that the injection may have made it feel a little better than it had previously.  Objective: Vital signs are stable alert oriented x 3.  Pulses are palpable.  Severe pain on palpation of the fibular sesamoid of the left foot tenderness on end range of motion of the first metatarsal phalangeal joint left foot.  Assessment: Probable fracture of the fibular sesamoid or tear of the short flexor tendon or tear in the joint itself.  Cannot rule out osteoarthritic process.  Plan: Discussed etiology pathology conservative versus surgical therapies at this point offered him an injection he declined he is going to wear his cam boot and he would like to try to get back to work with limitations.  Limitations should include limited his time on his foot.  Allowing him to sit and rest.  Also climbing squatting down climbing out of holes and going up on his toes should be avoided.  Still requesting MRI left forefoot nonunion or chronic fracture of the fibular sesamoid cannot rule out a tear of the flexor tendon or the joint osteoarthritic change.  Evaluating for differential diagnoses due to the chronic intractable pain and surgical consideration.

## 2022-10-10 NOTE — Telephone Encounter (Signed)
Received patients new insurance and everything has been faxed over to [facility] for scheduling and PA processing.

## 2022-10-11 ENCOUNTER — Encounter: Payer: Self-pay | Admitting: Podiatry

## 2022-10-15 ENCOUNTER — Encounter: Payer: Self-pay | Admitting: Podiatry

## 2022-10-16 ENCOUNTER — Ambulatory Visit
Admission: RE | Admit: 2022-10-16 | Discharge: 2022-10-16 | Disposition: A | Discharge status: Home / Self Care | Payer: 59 | Source: Ambulatory Visit | Attending: Podiatry | Admitting: Podiatry

## 2022-10-16 DIAGNOSIS — M25872 Other specified joint disorders, left ankle and foot: Secondary | ICD-10-CM

## 2022-10-17 ENCOUNTER — Telehealth: Payer: Self-pay | Admitting: Podiatry

## 2022-10-17 NOTE — Telephone Encounter (Signed)
Pt calling for mri stat results.  Please advise

## 2022-10-25 ENCOUNTER — Ambulatory Visit (INDEPENDENT_AMBULATORY_CARE_PROVIDER_SITE_OTHER): Payer: 59 | Admitting: Podiatry

## 2022-10-25 DIAGNOSIS — Q6672 Congenital pes cavus, left foot: Secondary | ICD-10-CM

## 2022-10-25 DIAGNOSIS — S99921S Unspecified injury of right foot, sequela: Secondary | ICD-10-CM | POA: Diagnosis not present

## 2022-10-25 DIAGNOSIS — Q667 Congenital pes cavus, unspecified foot: Secondary | ICD-10-CM

## 2022-10-25 NOTE — Progress Notes (Signed)
He presents today for a left plantar plate injury versus sesamoiditis to the first MTPJ joint.  Patient states that is about the same and still causing him pain he is known to Dr. Milinda Pointer who has treated him with all conservative care.  He wanted discuss other treatment options cam boot immobilization did not help  Objective: Vital signs are stable alert oriented x 3.  Pulses are palpable.  Severe pain on palpation of the fibular sesamoid of the left foot tenderness on end range of motion of the first metatarsal phalangeal joint left foot.  IMPRESSION: 1. Complex injury involving the lateral/fibular aspect of the plantar plate as detailed above. High-grade partial tearing of the lateral metatarsal sesamoidal ligament and also the adductor hallucis tendon. 2. The medial metatarsal sesamoidal ligament, the intersesamoidal ligament, the FHL and abductor hallucis tendons are intact. 3. No findings for sesamoiditis or stress fracture. 4. Intermetatarsal bursitis at the first 3 interspaces. 5. 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  Assessment: Complex injury of the plantar plate and partial tearing of the sesamoidal ligament  Plan: Discussed the option of steroid injection at this time.  Given the amount of pain that he is experiencing he will benefit from a steroid injection of decrease inflammatory component associate with pain.  Patient given a Tri-Lock ankle brace to transition out of the boot to see if that helps.  Pes cavus -I explained to patient the etiology of pes cavus and relationship with first MTP pain and various treatment options were discussed.  Given patient foot structure in the setting of first MT I believe patient will benefit from custom-made orthotics to help control the hindfoot motion support the arch of the foot and take the stress away from plantar fascial.  Patient agrees with the plan like to proceed with  orthotics -Patient was casted for orthotics reverse Morton's extension

## 2022-10-29 ENCOUNTER — Telehealth: Payer: Self-pay

## 2022-10-29 NOTE — Telephone Encounter (Signed)
Encounter created in error

## 2022-10-30 ENCOUNTER — Telehealth: Payer: Self-pay | Admitting: Podiatry

## 2022-10-30 ENCOUNTER — Encounter: Payer: Self-pay | Admitting: Podiatry

## 2022-10-30 NOTE — Telephone Encounter (Signed)
Travis Carpenter employer would like a update on his restrictions at work. His previous letter stated, Travis Carpenter will need to minimize time on his foot             He needs to have the ability to sit as needed, to help with pain relief             Refrain from climbing, squatting down, climbing out of holes and going up on             his toes.   Are the restrictions still the same?

## 2022-10-31 ENCOUNTER — Ambulatory Visit (INDEPENDENT_AMBULATORY_CARE_PROVIDER_SITE_OTHER): Payer: 59 | Admitting: Podiatry

## 2022-10-31 ENCOUNTER — Encounter: Payer: Self-pay | Admitting: Podiatry

## 2022-10-31 ENCOUNTER — Ambulatory Visit: Payer: 59 | Admitting: Podiatry

## 2022-10-31 DIAGNOSIS — T148XXA Other injury of unspecified body region, initial encounter: Secondary | ICD-10-CM | POA: Diagnosis not present

## 2022-10-31 NOTE — Progress Notes (Signed)
He presents today with his wife to discuss some left foot surgery.  He states that he is ready to get this thing fixed so he can get back to work and work normally.  Objective: Vital signs are stable alert oriented x 3 no change in physical exam.  No erythema edema cellulitis drainage or odor.  MRI does demonstrate a tear of the lateral aspect of the first metatarsal phalangeal joint capsule as well as the abductor tendon.  Assessment: Tear of the lateral capsule first metatarsophalangeal joint left.  Plan: Discussed etiology pathology conservative surgical therapies explained to him that we could fix the lateral capsule may develop some neuritis and some scar tissue postoperatively.  He understands this is amenable to it we did discuss the possible postop complications may include but not limited to postop pain bleeding swell infection recurrence need further surgery overcorrection under correction loss of digit loss limb loss of life.

## 2022-11-05 ENCOUNTER — Encounter: Payer: Self-pay | Admitting: Podiatry

## 2022-11-06 ENCOUNTER — Telehealth: Payer: Self-pay | Admitting: Urology

## 2022-11-06 NOTE — Telephone Encounter (Signed)
DOS - 11/09/22  PRIMARY REPAIR CAPSULE 1ST MTPJ LEFT --- [id number]  [employer]   PER [employer] SYSTEM FOR CPT CODE 42595 NO PRIOR AUTH IS REQUIRED.  REF # [id number]

## 2022-11-08 ENCOUNTER — Other Ambulatory Visit: Payer: Self-pay | Admitting: Podiatry

## 2022-11-08 HISTORY — PX: FOOT SURGERY: SHX648

## 2022-11-08 MED ORDER — CEPHALEXIN 500 MG PO CAPS: 500.0000 mg | ORAL_CAPSULE | Freq: Three times a day (TID) | ORAL | 0 refills | Status: DC

## 2022-11-08 MED ORDER — OXYCODONE-ACETAMINOPHEN 10-325 MG PO TABS: 1.0000 | ORAL_TABLET | Freq: Three times a day (TID) | ORAL | 0 refills | Status: AC | PRN

## 2022-11-08 MED ORDER — ONDANSETRON HCL 4 MG PO TABS: 4.0000 mg | ORAL_TABLET | Freq: Three times a day (TID) | ORAL | 0 refills | Status: DC | PRN

## 2022-11-14 ENCOUNTER — Encounter: Payer: 59 | Admitting: Podiatry

## 2022-11-16 DIAGNOSIS — T148XXA Other injury of unspecified body region, initial encounter: Secondary | ICD-10-CM | POA: Diagnosis not present

## 2022-11-21 ENCOUNTER — Encounter: Payer: Self-pay | Admitting: Podiatry

## 2022-11-21 ENCOUNTER — Ambulatory Visit: Payer: 59 | Admitting: Podiatry

## 2022-11-21 ENCOUNTER — Ambulatory Visit (INDEPENDENT_AMBULATORY_CARE_PROVIDER_SITE_OTHER): Payer: 59 | Admitting: Podiatry

## 2022-11-21 DIAGNOSIS — T148XXA Other injury of unspecified body region, initial encounter: Secondary | ICD-10-CM

## 2022-11-21 DIAGNOSIS — Z9889 Other specified postprocedural states: Secondary | ICD-10-CM

## 2022-11-21 NOTE — Progress Notes (Signed)
He presents today with his father for postop visit date of surgery 11/16/2018 for primary repair lateral capsule first metatarsophalangeal joint left foot.  States that has been a little bit sore but all in all seems to be doing pretty well.  Objective: Vital signs stable he is alert and oriented x 3.  Pulses are palpable.  Dry sterile dressing was intact dry clean was about once removed demonstrates minimal bleeding no erythema cellulitis drainage or odor sutures are intact margins well coapted.  Assessment: Well-healing surgical foot x 1 week.  Plan: Redressed today dressed a compressive dressing would like to follow-up with him again in a week or so.  Sutures will not be removed until he has been 3 weeks out of surgery but he is to continue nonweightbearing stance.

## 2022-11-28 ENCOUNTER — Encounter: Payer: 59 | Admitting: Podiatry

## 2022-12-05 ENCOUNTER — Ambulatory Visit (INDEPENDENT_AMBULATORY_CARE_PROVIDER_SITE_OTHER): Payer: 59 | Admitting: Podiatry

## 2022-12-05 DIAGNOSIS — T148XXA Other injury of unspecified body region, initial encounter: Secondary | ICD-10-CM

## 2022-12-10 ENCOUNTER — Encounter: Payer: Self-pay | Admitting: Podiatry

## 2022-12-10 NOTE — Progress Notes (Signed)
  Subjective:  Patient ID: Travis Carpenter, male    DOB: Dec 08, 1980,  MRN: [id number]  Chief Complaint  Patient presents with   Routine Post Op    POV # 2 DOS 11/16/22 --- PRIMARY REPAIR LATERAL CAPSALE LEFT 1ST MPJ/DR HYATT PT /pick up orthotics      42 y.o. male returns for post-op check.  He is doing well pain is controlled  Review of Systems: Negative except as noted in the HPI. Denies N/V/F/Ch.   Objective:  There were no vitals filed for this visit. There is no height or weight on file to calculate BMI. Constitutional Well developed. Well nourished.  Vascular Foot warm and well perfused. Capillary refill normal to all digits.  Calf is soft and supple, no posterior calf or knee pain, negative Homans' sign  Neurologic Normal speech. Oriented to person, place, and time. Epicritic sensation to light touch grossly present bilaterally.  Dermatologic Skin healing well without signs of infection. Skin edges well coapted without signs of infection.  Orthopedic: Tenderness to palpation noted about the surgical site.    Assessment:   1. Joint capsule tear    Plan:  Patient was evaluated and treated and all questions answered.  S/p foot surgery left -Progressing as expected post-operatively.  All sutures removed uneventfully.  May begin bathing and apply lotion to scars.  Can begin gradual weightbearing to heel.  Return in 3 weeks for follow-up with Dr. Al Corpus.    Return in about 3 weeks (around 12/26/2022).

## 2022-12-19 ENCOUNTER — Ambulatory Visit (INDEPENDENT_AMBULATORY_CARE_PROVIDER_SITE_OTHER): Payer: No Typology Code available for payment source | Admitting: Podiatry

## 2022-12-19 ENCOUNTER — Encounter: Payer: 59 | Admitting: Podiatry

## 2022-12-19 DIAGNOSIS — T148XXA Other injury of unspecified body region, initial encounter: Secondary | ICD-10-CM

## 2022-12-20 NOTE — Progress Notes (Signed)
  Subjective:  Patient ID: Travis Carpenter, male    DOB: 1981/04/16,  MRN: [id number]  Chief Complaint  Patient presents with   Routine Post Op    POV # 3 DOS 11/16/22 --- PRIMARY REPAIR LATERAL CAPSALE LEFT 1ST [employer]( self pay)      42 y.o. male returns for post-op check.    Review of Systems: Negative except as noted in the HPI. Denies N/V/F/Ch.   Objective:  There were no vitals filed for this visit. There is no height or weight on file to calculate BMI. Constitutional Well developed. Well nourished.  Vascular Foot warm and well perfused. Capillary refill normal to all digits.  Calf is soft and supple, no posterior calf or knee pain, negative Homans' sign  Neurologic Normal speech. Oriented to person, place, and time. Epicritic sensation to light touch grossly present bilaterally.  Dermatologic Incision healing well and not hypertrophic  Orthopedic: He has no tenderness to palpation noted about the surgical site.  Good early range of motion of MTPJ    Assessment:   1. Joint capsule tear    Plan:  Patient was evaluated and treated and all questions answered.  S/p foot surgery left -Overall doing very well.  May begin full weightbearing as tolerated in the cam walker boot.  Would like him to begin gentle range of motion exercises actively and passively manually with his hand for the MTPJ to prevent stiffness.  Return in 3 weeks for follow-up hopefully can transition back to regular shoe gear at that point  Return in about 3 weeks (around 01/09/2023) for post op (new x-rays).

## 2022-12-26 ENCOUNTER — Encounter: Payer: 59 | Admitting: Podiatry

## 2022-12-26 ENCOUNTER — Encounter: Payer: 59 | Admitting: Podiatrist

## 2023-01-09 ENCOUNTER — Ambulatory Visit (INDEPENDENT_AMBULATORY_CARE_PROVIDER_SITE_OTHER): Payer: Self-pay

## 2023-01-09 ENCOUNTER — Ambulatory Visit (INDEPENDENT_AMBULATORY_CARE_PROVIDER_SITE_OTHER): Payer: Self-pay | Admitting: Podiatry

## 2023-01-09 DIAGNOSIS — Z9889 Other specified postprocedural states: Secondary | ICD-10-CM

## 2023-01-09 NOTE — Progress Notes (Signed)
He presents today for follow-up of his surgery date of surgery 11/16/2022 states that the foot is feeling really good he is continues to walk in his cam boot.  States that it pulls just a little bit and he would like to try to get back to regular shoes before going back to work.  Objective: Vital signs are stable alert oriented x 3.  Pulses are palpable.  There is no pain on palpation of the surgical sites or orientation of the toe.  Radiographs taken today do not demonstrate any type of osseous abnormalities toe appears to be rectus and in good position.  Assessment well-healing surgical foot.  Plan: I will allow him to start walking in regular shoes for the next 2 weeks he may need to go back and forth between the cam boot and his regular tennis shoe.  Follow-up with him in 2 weeks hopefully to return to work.

## 2023-01-23 ENCOUNTER — Encounter: Payer: Self-pay | Admitting: Podiatry

## 2023-01-23 ENCOUNTER — Ambulatory Visit (INDEPENDENT_AMBULATORY_CARE_PROVIDER_SITE_OTHER): Payer: No Typology Code available for payment source | Admitting: Podiatry

## 2023-01-23 DIAGNOSIS — M722 Plantar fascial fibromatosis: Secondary | ICD-10-CM | POA: Diagnosis not present

## 2023-01-23 DIAGNOSIS — Z9889 Other specified postprocedural states: Secondary | ICD-10-CM

## 2023-01-23 DIAGNOSIS — T148XXA Other injury of unspecified body region, initial encounter: Secondary | ICD-10-CM

## 2023-01-23 MED ORDER — TRIAMCINOLONE ACETONIDE 40 MG/ML IJ SUSP
20.0000 mg | Freq: Once | INTRAMUSCULAR | Status: AC
  Administered 2023-01-23: 20 mg

## 2023-01-23 NOTE — Progress Notes (Signed)
He presents today for follow-up of his lateral ligament repair date of surgery 11/16/2022.  States that is still numb and some swelling noted.  He states this really started to bother me here and on the outside of my foot now as he refers to the plantar medial heel and the dorsal lateral aspect of the foot left particularly overlying the fourth fifth tarsometatarsal joints.  Objective: Vital signs stable alert oriented x 3.  Pulses are palpable.  He has good range of motion with no swelling erythema edema cellulitis drainage or odor around the first metatarsal phalangeal joint where the surgery was performed this is gone on to heal very nicely.  He does have tenderness on palpation of the medial calcaneal tubercle of the left heel and overlying the fourth and fifth tarsometatarsal joint and plain on frontal plane range of motion.  Assessment: Resolving and well-healing first metatarsal phalangeal joint surgical symptomatology.  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  Plan: Discussed etiology pathology and surgical therapies at this point I would request that he stay out of work for least another 2 to 3 weeks I reinjected the left heel today 20 mg Kenalog 5 mg Marcaine point maximal tenderness.  And dispensed his orthotics.  I will follow-up with him in 2 weeks before sending him back to work.

## 2023-02-06 ENCOUNTER — Encounter: Payer: Self-pay | Admitting: Physician Assistant

## 2023-02-06 ENCOUNTER — Encounter: Payer: Self-pay | Admitting: Podiatry

## 2023-02-06 ENCOUNTER — Ambulatory Visit (INDEPENDENT_AMBULATORY_CARE_PROVIDER_SITE_OTHER): Payer: Self-pay | Admitting: Podiatry

## 2023-02-06 ENCOUNTER — Ambulatory Visit: Payer: Self-pay | Admitting: Physician Assistant

## 2023-02-06 VITALS — BP 129/87 | HR 82 | Temp 98.6°F | Resp 12

## 2023-02-06 DIAGNOSIS — Z9889 Other specified postprocedural states: Secondary | ICD-10-CM

## 2023-02-06 DIAGNOSIS — T148XXA Other injury of unspecified body region, initial encounter: Secondary | ICD-10-CM

## 2023-02-06 NOTE — Progress Notes (Signed)
Presents to clinic with work note from [facility] - Dr. Ernestene Kiel.  S/P Left Foot Surgery - Ligament, Tendon, Muscle Repair  States he's changing jobs from [employer] to the [employer]. Supposed to return to work on Monday.  AMD

## 2023-02-06 NOTE — Progress Notes (Signed)
He presents today for his final postop visit date of surgery November 16, 2022 repair of his lateral first metatarsophalangeal joint capsule.  He states that he is doing just fine his right knee is starting to hurt.  But he is doing much better as far as the first metatarsophalangeal joint left.  Objective: Vital signs stable alert oriented x 3 there is no erythema edema salines drainage and has great range of motion of the first metatarsophalangeal joint full muscle strength on dorsiflexion and plantarflexion.  Assessment: Well-healing surgical foot date of surgery 11/16/2022.  Plan: I am going to let him return to work Monday full duty.

## 2023-02-06 NOTE — Progress Notes (Signed)
   Subjective: Return to work status    Patient ID: Travis Carpenter, male    DOB: 1980-10-08, 42 y.o.   MRN: [id number]  HPI Patient has been cleared by podiatrist to return back to full duty on 02/11/2023.  Patient had left foot surgery on 11/16/2022.   Review of Systems BPH and gout.    Objective:   Physical Exam  BP 129/87  BP Location Left Arm  Patient Position Sitting  Cuff Size Large  Pulse 82  Resp 12  Temp 98.6 F (37 C)  Temp src Temporal  SpO2 97 %  No acute distress.  Ambulates with normal gait.  Patient has full and equal range of motion of the left foot.      Assessment & Plan: Status post joint capsule tear left foot   Patient return back to a trial of full duty.  Follow-up if condition worsens.

## 2023-10-01 ENCOUNTER — Other Ambulatory Visit: Payer: Self-pay | Admitting: Physical Medicine and Rehabilitation

## 2023-10-01 ENCOUNTER — Encounter: Payer: Self-pay | Admitting: Physical Medicine and Rehabilitation

## 2023-10-01 DIAGNOSIS — M5416 Radiculopathy, lumbar region: Secondary | ICD-10-CM

## 2023-10-04 ENCOUNTER — Ambulatory Visit
Admission: RE | Admit: 2023-10-04 | Discharge: 2023-10-04 | Disposition: A | Discharge status: Home / Self Care | Payer: 59 | Source: Ambulatory Visit | Attending: Physical Medicine and Rehabilitation | Admitting: Physical Medicine and Rehabilitation

## 2023-10-04 DIAGNOSIS — M5416 Radiculopathy, lumbar region: Secondary | ICD-10-CM
